# Patient Record
Sex: Male | Born: 1935 | Race: White | Hispanic: No | Marital: Married | Smoking: Former smoker
Health system: Southern US, Community
[De-identification: ages and names within clinical notes are randomized; demographics above are authoritative.]

## PROBLEM LIST (undated history)

## (undated) DIAGNOSIS — M1990 Unspecified osteoarthritis, unspecified site: Secondary | ICD-10-CM

## (undated) DIAGNOSIS — F419 Anxiety disorder, unspecified: Secondary | ICD-10-CM

## (undated) DIAGNOSIS — K219 Gastro-esophageal reflux disease without esophagitis: Secondary | ICD-10-CM

## (undated) DIAGNOSIS — G629 Polyneuropathy, unspecified: Secondary | ICD-10-CM

## (undated) DIAGNOSIS — G8929 Other chronic pain: Secondary | ICD-10-CM

## (undated) DIAGNOSIS — M545 Low back pain: Secondary | ICD-10-CM

## (undated) DIAGNOSIS — T7840XA Allergy, unspecified, initial encounter: Secondary | ICD-10-CM

## (undated) DIAGNOSIS — I10 Essential (primary) hypertension: Secondary | ICD-10-CM

## (undated) HISTORY — DX: Polyneuropathy, unspecified: G62.9

## (undated) HISTORY — PX: TONSILLECTOMY: SUR1361

## (undated) HISTORY — PX: APPENDECTOMY: SHX54

## (undated) HISTORY — DX: Unspecified osteoarthritis, unspecified site: M19.90

## (undated) HISTORY — DX: Allergy, unspecified, initial encounter: T78.40XA

## (undated) HISTORY — DX: Other chronic pain: G89.29

## (undated) HISTORY — DX: Low back pain: M54.5

## (undated) HISTORY — PX: EYE SURGERY: SHX253

## (undated) HISTORY — PX: HERNIA REPAIR: SHX51

---

## 2009-12-25 ENCOUNTER — Encounter: Admission: RE | Admit: 2009-12-25 | Discharge: 2009-12-25 | Payer: Self-pay | Admitting: Family Medicine

## 2010-11-09 HISTORY — PX: ROTATOR CUFF REPAIR: SHX139

## 2011-10-23 ENCOUNTER — Other Ambulatory Visit: Payer: Self-pay | Admitting: Family Medicine

## 2011-10-23 DIAGNOSIS — M545 Low back pain: Secondary | ICD-10-CM

## 2011-10-29 ENCOUNTER — Ambulatory Visit
Admission: RE | Admit: 2011-10-29 | Discharge: 2011-10-29 | Disposition: A | Discharge status: Home / Self Care | Payer: Medicare Other | Source: Ambulatory Visit | Attending: Family Medicine | Admitting: Family Medicine

## 2011-10-29 DIAGNOSIS — M545 Low back pain: Secondary | ICD-10-CM

## 2013-11-09 HISTORY — PX: BACK SURGERY: SHX140

## 2013-12-19 ENCOUNTER — Other Ambulatory Visit: Payer: Self-pay | Admitting: Neurosurgery

## 2013-12-27 ENCOUNTER — Encounter (HOSPITAL_COMMUNITY): Payer: Self-pay | Admitting: Pharmacy Technician

## 2013-12-29 NOTE — Pre-Procedure Instructions (Signed)
Anthony Clay  12/29/2013   Your procedure is scheduled on:  Wednesday, March 4th  Report to Admitting at 0630 AM.  Call this number if you have problems the morning of surgery: [phone]   Remember:   Do not eat food or drink liquids after midnight.   Take these medicines the morning of surgery with A SIP OF WATER: Norvasc   Do not wear jewelry.  Do not wear lotions, powders, or perfume,deodorant.  Do not shave 48 hours prior to surgery. Men may shave face and neck.  Do not bring valuables to the hospital.  [facility] is not responsible for any belongings or valuables.               Contacts, dentures or bridgework may not be worn into surgery.  Leave suitcase in the car. After surgery it may be brought to your room.  For patients admitted to the hospital, discharge time is determined by your treatment team.               Patients discharged the day of surgery will not be allowed to drive home.  Please read over the following fact sheets that you were given: Pain Booklet, Coughing and Deep Breathing, Blood Transfusion Information, MRSA Information and Surgical Site Infection Prevention  [place] - Preparing for Surgery  Before surgery, you can play an important role.  Because skin is not sterile, your skin needs to be as free of germs as possible.  You can reduce the number of germs on you skin by washing with CHG (chlorahexidine gluconate) soap before surgery.  CHG is an antiseptic cleaner which kills germs and bonds with the skin to continue killing germs even after washing.  Please DO NOT use if you have an allergy to CHG or antibacterial soaps.  If your skin becomes reddened/irritated stop using the CHG and inform your nurse when you arrive at Short Stay.  Do not shave (including legs and underarms) for at least 48 hours prior to the first CHG shower.  You may shave your face.  Please follow these instructions carefully:   1.  Shower with CHG Soap the night before  surgery and the morning of Surgery.  2.  If you choose to wash your hair, wash your hair first as usual with your normal shampoo.  3.  After you shampoo, rinse your hair and body thoroughly to remove the shampoo.  4.  Use CHG as you would any other liquid soap.  You can apply CHG directly to the skin and wash gently with scrungie or a clean washcloth.  5.  Apply the CHG Soap to your body ONLY FROM THE NECK DOWN.  Do not use on open wounds or open sores.  Avoid contact with your eyes, ears, mouth and genitals (private parts).  Wash genitals (private parts) with your normal soap.  6.  Wash thoroughly, paying special attention to the area where your surgery will be performed.  7.  Thoroughly rinse your body with warm water from the neck down.  8.  DO NOT shower/wash with your normal soap after using and rinsing off the CHG Soap.  9.  Pat yourself dry with a clean towel.            10.  Wear clean pajamas.            11.  Place clean sheets on your bed the night of your first shower and do not sleep with pets.  Day  of Surgery  Do not apply any lotions/deoderants the morning of surgery.  Please wear clean clothes to the hospital/surgery center.

## 2014-01-01 ENCOUNTER — Encounter (HOSPITAL_COMMUNITY)
Admission: RE | Admit: 2014-01-01 | Discharge: 2014-01-01 | Disposition: A | Discharge status: Home / Self Care | Payer: Medicare Other | Source: Ambulatory Visit | Attending: Neurosurgery | Admitting: Neurosurgery

## 2014-01-01 ENCOUNTER — Encounter (HOSPITAL_COMMUNITY): Payer: Self-pay

## 2014-01-01 ENCOUNTER — Encounter (HOSPITAL_COMMUNITY)
Admission: RE | Admit: 2014-01-01 | Discharge: 2014-01-01 | Disposition: A | Discharge status: Home / Self Care | Payer: Medicare Other | Source: Ambulatory Visit | Attending: Anesthesiology | Admitting: Anesthesiology

## 2014-01-01 DIAGNOSIS — Z01810 Encounter for preprocedural cardiovascular examination: Secondary | ICD-10-CM | POA: Insufficient documentation

## 2014-01-01 DIAGNOSIS — Z01818 Encounter for other preprocedural examination: Secondary | ICD-10-CM | POA: Insufficient documentation

## 2014-01-01 DIAGNOSIS — Z01812 Encounter for preprocedural laboratory examination: Secondary | ICD-10-CM | POA: Insufficient documentation

## 2014-01-01 HISTORY — DX: Unspecified osteoarthritis, unspecified site: M19.90

## 2014-01-01 HISTORY — DX: Gastro-esophageal reflux disease without esophagitis: K21.9

## 2014-01-01 HISTORY — DX: Essential (primary) hypertension: I10

## 2014-01-01 HISTORY — DX: Anxiety disorder, unspecified: F41.9

## 2014-01-01 LAB — CBC
HCT: 37.4 % — ABNORMAL LOW (ref 39.0–52.0)
HEMOGLOBIN: 12.5 g/dL — AB (ref 13.0–17.0)
MCH: 30.3 pg (ref 26.0–34.0)
MCHC: 33.4 g/dL (ref 30.0–36.0)
MCV: 90.8 fL (ref 78.0–100.0)
Platelets: 229 10*3/uL (ref 150–400)
RBC: 4.12 MIL/uL — ABNORMAL LOW (ref 4.22–5.81)
RDW: 15.3 % (ref 11.5–15.5)
WBC: 6.5 10*3/uL (ref 4.0–10.5)

## 2014-01-01 LAB — BASIC METABOLIC PANEL
BUN: 28 mg/dL — ABNORMAL HIGH (ref 6–23)
CALCIUM: 9.4 mg/dL (ref 8.4–10.5)
CO2: 26 mEq/L (ref 19–32)
CREATININE: 1.06 mg/dL (ref 0.50–1.35)
Chloride: 101 mEq/L (ref 96–112)
GFR, EST AFRICAN AMERICAN: 73 mL/min — AB (ref >90)
GFR, EST NON AFRICAN AMERICAN: 63 mL/min — AB (ref >90)
Glucose, Bld: 94 mg/dL (ref 70–99)
Potassium: 4.9 mEq/L (ref 3.7–5.3)
SODIUM: 139 meq/L (ref 137–147)

## 2014-01-01 LAB — ABO/RH: ABO/RH(D): O POS

## 2014-01-01 LAB — TYPE AND SCREEN
ABO/RH(D): O POS
ANTIBODY SCREEN: NEGATIVE

## 2014-01-01 LAB — SURGICAL PCR SCREEN
MRSA, PCR: NEGATIVE
Staphylococcus aureus: POSITIVE — AB

## 2014-01-01 NOTE — Progress Notes (Signed)
01/01/14 0814  OBSTRUCTIVE SLEEP APNEA  Have you ever been diagnosed with sleep apnea through a sleep study? No  Do you snore loudly (loud enough to be heard through closed doors)?  0  Do you often feel tired, fatigued, or sleepy during the daytime? 1 (d/t recent addition of medications)  Has anyone observed you stop breathing during your sleep? 0  Do you have, or are you being treated for high blood pressure? 1  BMI more than 35 kg/m2? 0  Age over 78 years old? 1  Neck circumference greater than 40 cm/18 inches? 0  Gender: 1  Obstructive Sleep Apnea Score 4  Score 4 or greater  Results sent to PCP

## 2014-01-09 MED ORDER — CEFAZOLIN SODIUM-DEXTROSE 2-3 GM-% IV SOLR
2.0000 g | INTRAVENOUS | Status: AC
  Administered 2014-01-10: 2 g via INTRAVENOUS
  Filled 2014-01-09: qty 50

## 2014-01-10 ENCOUNTER — Inpatient Hospital Stay (HOSPITAL_COMMUNITY)
Admission: RE | Admit: 2014-01-10 | Discharge: 2014-01-17 | DRG: 457 | Disposition: A | Discharge status: Skilled Nursing Facility | Payer: Medicare Other | Source: Ambulatory Visit | Attending: Neurosurgery | Admitting: Neurosurgery

## 2014-01-10 ENCOUNTER — Encounter (HOSPITAL_COMMUNITY)
Admission: RE | Disposition: A | Discharge status: Skilled Nursing Facility | Payer: Medicare Other | Source: Ambulatory Visit | Attending: Neurosurgery

## 2014-01-10 ENCOUNTER — Inpatient Hospital Stay (HOSPITAL_COMMUNITY): Payer: Medicare Other

## 2014-01-10 ENCOUNTER — Encounter (HOSPITAL_COMMUNITY): Payer: Medicare Other | Admitting: Anesthesiology

## 2014-01-10 ENCOUNTER — Encounter (HOSPITAL_COMMUNITY): Payer: Self-pay | Admitting: *Deleted

## 2014-01-10 ENCOUNTER — Inpatient Hospital Stay (HOSPITAL_COMMUNITY): Payer: Medicare Other | Admitting: Anesthesiology

## 2014-01-10 DIAGNOSIS — M4316 Spondylolisthesis, lumbar region: Secondary | ICD-10-CM | POA: Diagnosis present

## 2014-01-10 DIAGNOSIS — G9741 Accidental puncture or laceration of dura during a procedure: Secondary | ICD-10-CM | POA: Diagnosis not present

## 2014-01-10 DIAGNOSIS — Z79899 Other long term (current) drug therapy: Secondary | ICD-10-CM

## 2014-01-10 DIAGNOSIS — I10 Essential (primary) hypertension: Secondary | ICD-10-CM | POA: Diagnosis present

## 2014-01-10 DIAGNOSIS — M47817 Spondylosis without myelopathy or radiculopathy, lumbosacral region: Secondary | ICD-10-CM | POA: Diagnosis present

## 2014-01-10 DIAGNOSIS — M129 Arthropathy, unspecified: Secondary | ICD-10-CM | POA: Diagnosis present

## 2014-01-10 DIAGNOSIS — M4120 Other idiopathic scoliosis, site unspecified: Principal | ICD-10-CM | POA: Diagnosis present

## 2014-01-10 DIAGNOSIS — M48061 Spinal stenosis, lumbar region without neurogenic claudication: Secondary | ICD-10-CM | POA: Diagnosis present

## 2014-01-10 DIAGNOSIS — M4310 Spondylolisthesis, site unspecified: Secondary | ICD-10-CM | POA: Diagnosis present

## 2014-01-10 DIAGNOSIS — K219 Gastro-esophageal reflux disease without esophagitis: Secondary | ICD-10-CM | POA: Diagnosis present

## 2014-01-10 DIAGNOSIS — F411 Generalized anxiety disorder: Secondary | ICD-10-CM | POA: Diagnosis present

## 2014-01-10 DIAGNOSIS — IMO0002 Reserved for concepts with insufficient information to code with codable children: Secondary | ICD-10-CM | POA: Diagnosis not present

## 2014-01-10 SURGERY — POSTERIOR LUMBAR FUSION 3 LEVEL
Anesthesia: General | Site: Spine Lumbar

## 2014-01-10 MED ORDER — PHENYLEPHRINE HCL 10 MG/ML IJ SOLN
INTRAMUSCULAR | Status: DC | PRN
  Administered 2014-01-10: 80 ug via INTRAVENOUS

## 2014-01-10 MED ORDER — AMLODIPINE BESYLATE 5 MG PO TABS
5.0000 mg | ORAL_TABLET | Freq: Every day | ORAL | Status: DC
  Administered 2014-01-11 – 2014-01-17 (×7): 5 mg via ORAL
  Filled 2014-01-10 (×7): qty 1

## 2014-01-10 MED ORDER — DIAZEPAM 5 MG PO TABS
5.0000 mg | ORAL_TABLET | Freq: Four times a day (QID) | ORAL | Status: DC | PRN
  Administered 2014-01-11 – 2014-01-16 (×9): 5 mg via ORAL
  Filled 2014-01-10 (×9): qty 1

## 2014-01-10 MED ORDER — SODIUM CHLORIDE 0.9 % IJ SOLN: 3.0000 mL | INTRAMUSCULAR | Status: DC | PRN

## 2014-01-10 MED ORDER — FENTANYL CITRATE 0.05 MG/ML IJ SOLN
INTRAMUSCULAR | Status: DC | PRN
  Administered 2014-01-10: 50 ug via INTRAVENOUS
  Administered 2014-01-10: 100 ug via INTRAVENOUS
  Administered 2014-01-10 (×3): 50 ug via INTRAVENOUS

## 2014-01-10 MED ORDER — HYDROMORPHONE HCL PF 1 MG/ML IJ SOLN: 0.2500 mg | INTRAMUSCULAR | Status: DC | PRN

## 2014-01-10 MED ORDER — ROCURONIUM BROMIDE 100 MG/10ML IV SOLN
INTRAVENOUS | Status: DC | PRN
  Administered 2014-01-10 (×4): 10 mg via INTRAVENOUS
  Administered 2014-01-10: 50 mg via INTRAVENOUS
  Administered 2014-01-10 (×6): 10 mg via INTRAVENOUS

## 2014-01-10 MED ORDER — LIDOCAINE HCL (CARDIAC) 20 MG/ML IV SOLN
INTRAVENOUS | Status: DC | PRN
  Administered 2014-01-10: 100 mg via INTRAVENOUS

## 2014-01-10 MED ORDER — FERROUS SULFATE 325 (65 FE) MG PO TABS
325.0000 mg | ORAL_TABLET | Freq: Every day | ORAL | Status: DC
  Administered 2014-01-11 – 2014-01-17 (×7): 325 mg via ORAL
  Filled 2014-01-10 (×9): qty 1

## 2014-01-10 MED ORDER — CEFAZOLIN SODIUM 1-5 GM-% IV SOLN
1.0000 g | Freq: Three times a day (TID) | INTRAVENOUS | Status: AC
  Administered 2014-01-10 – 2014-01-11 (×2): 1 g via INTRAVENOUS
  Filled 2014-01-10 (×2): qty 50

## 2014-01-10 MED ORDER — EPHEDRINE SULFATE 50 MG/ML IJ SOLN
INTRAMUSCULAR | Status: DC | PRN
  Administered 2014-01-10: 10 mg via INTRAVENOUS

## 2014-01-10 MED ORDER — ACETAMINOPHEN 650 MG RE SUPP: 650.0000 mg | RECTAL | Status: DC | PRN

## 2014-01-10 MED ORDER — OXYCODONE HCL 5 MG PO TABS: 5.0000 mg | ORAL_TABLET | Freq: Once | ORAL | Status: DC | PRN

## 2014-01-10 MED ORDER — IRON 325 (65 FE) MG PO TABS: 1.0000 | ORAL_TABLET | Freq: Every day | ORAL | Status: DC

## 2014-01-10 MED ORDER — ONDANSETRON HCL 4 MG/2ML IJ SOLN
INTRAMUSCULAR | Status: DC | PRN
  Administered 2014-01-10: 4 mg via INTRAVENOUS

## 2014-01-10 MED ORDER — POLYETHYLENE GLYCOL 3350 17 G PO PACK
17.0000 g | PACK | Freq: Every day | ORAL | Status: DC | PRN
  Administered 2014-01-13 – 2014-01-14 (×2): 17 g via ORAL
  Filled 2014-01-10 (×2): qty 1

## 2014-01-10 MED ORDER — THROMBIN 20000 UNITS EX SOLR
CUTANEOUS | Status: DC | PRN
  Administered 2014-01-10: 10:00:00 via TOPICAL

## 2014-01-10 MED ORDER — HYDROMORPHONE HCL PF 1 MG/ML IJ SOLN
0.5000 mg | INTRAMUSCULAR | Status: DC | PRN
  Administered 2014-01-12: 1 mg via INTRAVENOUS
  Filled 2014-01-10: qty 1

## 2014-01-10 MED ORDER — PROPOFOL 10 MG/ML IV BOLUS
INTRAVENOUS | Status: AC
  Filled 2014-01-10: qty 20

## 2014-01-10 MED ORDER — PHENYLEPHRINE HCL 10 MG/ML IJ SOLN
10.0000 mg | INTRAVENOUS | Status: DC | PRN
  Administered 2014-01-10: 10 ug/min via INTRAVENOUS

## 2014-01-10 MED ORDER — POTASSIUM CHLORIDE IN NACL 20-0.9 MEQ/L-% IV SOLN
INTRAVENOUS | Status: DC
  Administered 2014-01-10: 20:00:00 via INTRAVENOUS
  Administered 2014-01-11: 90 mL via INTRAVENOUS
  Filled 2014-01-10 (×17): qty 1000

## 2014-01-10 MED ORDER — CITALOPRAM HYDROBROMIDE 20 MG PO TABS
20.0000 mg | ORAL_TABLET | Freq: Every day | ORAL | Status: DC
  Administered 2014-01-10 – 2014-01-16 (×7): 20 mg via ORAL
  Filled 2014-01-10 (×9): qty 1

## 2014-01-10 MED ORDER — ONDANSETRON HCL 4 MG/2ML IJ SOLN
INTRAMUSCULAR | Status: AC
  Filled 2014-01-10: qty 2

## 2014-01-10 MED ORDER — EPHEDRINE SULFATE 50 MG/ML IJ SOLN
INTRAMUSCULAR | Status: AC
  Filled 2014-01-10: qty 1

## 2014-01-10 MED ORDER — ROCURONIUM BROMIDE 50 MG/5ML IV SOLN
INTRAVENOUS | Status: AC
  Filled 2014-01-10: qty 3

## 2014-01-10 MED ORDER — ARTIFICIAL TEARS OP OINT
TOPICAL_OINTMENT | OPHTHALMIC | Status: AC
  Filled 2014-01-10: qty 3.5

## 2014-01-10 MED ORDER — SODIUM CHLORIDE 0.9 % IV SOLN: 250.0000 mL | INTRAVENOUS | Status: DC

## 2014-01-10 MED ORDER — NEOSTIGMINE METHYLSULFATE 1 MG/ML IJ SOLN
INTRAMUSCULAR | Status: DC | PRN
  Administered 2014-01-10: 4 mg via INTRAVENOUS

## 2014-01-10 MED ORDER — BUPIVACAINE HCL (PF) 0.5 % IJ SOLN
INTRAMUSCULAR | Status: DC | PRN
  Administered 2014-01-10: 30 mL

## 2014-01-10 MED ORDER — BRIMONIDINE TARTRATE 0.15 % OP SOLN
1.0000 [drp] | Freq: Every day | OPHTHALMIC | Status: DC
  Filled 2014-01-10: qty 5

## 2014-01-10 MED ORDER — PHENYLEPHRINE HCL 10 MG/ML IJ SOLN
INTRAMUSCULAR | Status: AC
  Filled 2014-01-10: qty 1

## 2014-01-10 MED ORDER — LACTATED RINGERS IV SOLN
INTRAVENOUS | Status: DC | PRN
  Administered 2014-01-10 (×3): via INTRAVENOUS

## 2014-01-10 MED ORDER — METOCLOPRAMIDE HCL 5 MG/ML IJ SOLN: 10.0000 mg | Freq: Once | INTRAMUSCULAR | Status: DC | PRN

## 2014-01-10 MED ORDER — FENTANYL CITRATE 0.05 MG/ML IJ SOLN
INTRAMUSCULAR | Status: AC
  Filled 2014-01-10: qty 5

## 2014-01-10 MED ORDER — BACITRACIN ZINC 500 UNIT/GM EX OINT
TOPICAL_OINTMENT | CUTANEOUS | Status: DC | PRN
  Administered 2014-01-10: 1 via TOPICAL

## 2014-01-10 MED ORDER — CALCIUM CARBONATE 600 MG PO TABS
600.0000 mg | ORAL_TABLET | Freq: Every day | ORAL | Status: DC
  Filled 2014-01-10 (×2): qty 1

## 2014-01-10 MED ORDER — GLYCOPYRROLATE 0.2 MG/ML IJ SOLN
INTRAMUSCULAR | Status: DC | PRN
  Administered 2014-01-10: .7 mg via INTRAVENOUS

## 2014-01-10 MED ORDER — 0.9 % SODIUM CHLORIDE (POUR BTL) OPTIME
TOPICAL | Status: DC | PRN
  Administered 2014-01-10 (×2): 1000 mL

## 2014-01-10 MED ORDER — LIDOCAINE-EPINEPHRINE 0.5 %-1:200000 IJ SOLN
INTRAMUSCULAR | Status: DC | PRN
  Administered 2014-01-10: 20 mL

## 2014-01-10 MED ORDER — SODIUM CHLORIDE 0.9 % IV SOLN
INTRAVENOUS | Status: DC | PRN
  Administered 2014-01-10 (×2): via INTRAVENOUS

## 2014-01-10 MED ORDER — ACETAMINOPHEN 325 MG PO TABS
650.0000 mg | ORAL_TABLET | ORAL | Status: DC | PRN
Start: 2014-01-10 — End: 2014-01-17

## 2014-01-10 MED ORDER — LIDOCAINE HCL (CARDIAC) 20 MG/ML IV SOLN
INTRAVENOUS | Status: AC
  Filled 2014-01-10: qty 5

## 2014-01-10 MED ORDER — ONDANSETRON HCL 4 MG/2ML IJ SOLN: 4.0000 mg | INTRAMUSCULAR | Status: DC | PRN

## 2014-01-10 MED ORDER — ADULT MULTIVITAMIN W/MINERALS CH
1.0000 | ORAL_TABLET | Freq: Every day | ORAL | Status: DC
  Administered 2014-01-11 – 2014-01-17 (×7): 1 via ORAL
  Filled 2014-01-10 (×7): qty 1

## 2014-01-10 MED ORDER — LATANOPROST 0.005 % OP SOLN
1.0000 [drp] | Freq: Every day | OPHTHALMIC | Status: DC
  Administered 2014-01-10 – 2014-01-16 (×7): 1 [drp] via OPHTHALMIC
  Filled 2014-01-10 (×2): qty 2.5

## 2014-01-10 MED ORDER — PHENOL 1.4 % MT LIQD: 1.0000 | OROMUCOSAL | Status: DC | PRN

## 2014-01-10 MED ORDER — MENTHOL 3 MG MT LOZG: 1.0000 | LOZENGE | OROMUCOSAL | Status: DC | PRN

## 2014-01-10 MED ORDER — HYDROCODONE-ACETAMINOPHEN 5-325 MG PO TABS
1.0000 | ORAL_TABLET | ORAL | Status: DC | PRN
  Administered 2014-01-12 (×2): 2 via ORAL
  Filled 2014-01-10 (×2): qty 2

## 2014-01-10 MED ORDER — PROPOFOL 10 MG/ML IV BOLUS
INTRAVENOUS | Status: DC | PRN
  Administered 2014-01-10: 200 mg via INTRAVENOUS

## 2014-01-10 MED ORDER — GLYCOPYRROLATE 0.2 MG/ML IJ SOLN
INTRAMUSCULAR | Status: AC
  Filled 2014-01-10: qty 2

## 2014-01-10 MED ORDER — NEOSTIGMINE METHYLSULFATE 1 MG/ML IJ SOLN
INTRAMUSCULAR | Status: AC
  Filled 2014-01-10: qty 10

## 2014-01-10 MED ORDER — ALBUMIN HUMAN 5 % IV SOLN
INTRAVENOUS | Status: DC | PRN
  Administered 2014-01-10 (×2): via INTRAVENOUS

## 2014-01-10 MED ORDER — SODIUM CHLORIDE 0.9 % IJ SOLN
3.0000 mL | Freq: Two times a day (BID) | INTRAMUSCULAR | Status: DC
  Administered 2014-01-10 – 2014-01-16 (×10): 3 mL via INTRAVENOUS

## 2014-01-10 MED ORDER — ROCURONIUM BROMIDE 50 MG/5ML IV SOLN
INTRAVENOUS | Status: AC
  Filled 2014-01-10: qty 1

## 2014-01-10 MED ORDER — OXYCODONE HCL 5 MG/5ML PO SOLN: 5.0000 mg | Freq: Once | ORAL | Status: DC | PRN

## 2014-01-10 MED ORDER — SENNA 8.6 MG PO TABS
1.0000 | ORAL_TABLET | Freq: Two times a day (BID) | ORAL | Status: DC
  Administered 2014-01-10 – 2014-01-17 (×14): 8.6 mg via ORAL
  Filled 2014-01-10 (×18): qty 1

## 2014-01-10 MED ORDER — OXYCODONE-ACETAMINOPHEN 5-325 MG PO TABS
1.0000 | ORAL_TABLET | ORAL | Status: DC | PRN
  Administered 2014-01-10 – 2014-01-12 (×4): 2 via ORAL
  Administered 2014-01-13 (×2): 1 via ORAL
  Administered 2014-01-13 – 2014-01-17 (×11): 2 via ORAL
  Filled 2014-01-10 (×3): qty 2
  Filled 2014-01-10: qty 1
  Filled 2014-01-10 (×13): qty 2

## 2014-01-10 MED ORDER — IRBESARTAN 150 MG PO TABS
150.0000 mg | ORAL_TABLET | Freq: Every day | ORAL | Status: DC
  Administered 2014-01-10 – 2014-01-17 (×8): 150 mg via ORAL
  Filled 2014-01-10 (×8): qty 1

## 2014-01-10 MED ORDER — CEFAZOLIN SODIUM-DEXTROSE 2-3 GM-% IV SOLR
INTRAVENOUS | Status: AC
  Filled 2014-01-10: qty 50

## 2014-01-10 MED ORDER — BRIMONIDINE TARTRATE 0.2 % OP SOLN
1.0000 [drp] | Freq: Every day | OPHTHALMIC | Status: DC
  Administered 2014-01-10 – 2014-01-16 (×7): 1 [drp] via OPHTHALMIC
  Filled 2014-01-10 (×2): qty 5

## 2014-01-10 SURGICAL SUPPLY — 84 items
BAG DECANTER FOR FLEXI CONT (MISCELLANEOUS) IMPLANT
BENZOIN TINCTURE PRP APPL 2/3 (GAUZE/BANDAGES/DRESSINGS) IMPLANT
BLADE SURG ROTATE 9660 (MISCELLANEOUS) IMPLANT
BUR MATCHSTICK NEURO 3.0 LAGG (BURR) ×3 IMPLANT
BUR PRECISION FLUTE 5.0 (BURR) ×3 IMPLANT
CAGE COROENT 9X9X23-4 (Cage) ×3 IMPLANT
CAGE COROENT MP 8X23 (Cage) ×3 IMPLANT
CANISTER SUCT 3000ML (MISCELLANEOUS) ×3 IMPLANT
CLOSURE WOUND 1/2 X4 (GAUZE/BANDAGES/DRESSINGS)
CONT SPEC 4OZ CLIKSEAL STRL BL (MISCELLANEOUS) ×3 IMPLANT
COVER BACK TABLE 24X17X13 BIG (DRAPES) IMPLANT
COVER TABLE BACK 60X90 (DRAPES) ×3 IMPLANT
DECANTER SPIKE VIAL GLASS SM (MISCELLANEOUS) ×3 IMPLANT
DERMABOND ADVANCED (GAUZE/BANDAGES/DRESSINGS)
DERMABOND ADVANCED .7 DNX12 (GAUZE/BANDAGES/DRESSINGS) IMPLANT
DRAPE C-ARM 42X72 X-RAY (DRAPES) ×6 IMPLANT
DRAPE LAPAROTOMY 100X72X124 (DRAPES) ×3 IMPLANT
DRAPE POUCH INSTRU U-SHP 10X18 (DRAPES) ×3 IMPLANT
DRAPE PROXIMA HALF (DRAPES) ×3 IMPLANT
DRAPE SURG 17X23 STRL (DRAPES) ×3 IMPLANT
DRESSING TELFA 8X3 (GAUZE/BANDAGES/DRESSINGS) IMPLANT
DRSG OPSITE POSTOP 4X10 (GAUZE/BANDAGES/DRESSINGS) ×3 IMPLANT
DURAPREP 26ML APPLICATOR (WOUND CARE) ×3 IMPLANT
DURASEAL APPLICATOR TIP (TIP) ×3 IMPLANT
DURASEAL SPINE SEALANT 3ML (MISCELLANEOUS) ×3 IMPLANT
ELECT REM PT RETURN 9FT ADLT (ELECTROSURGICAL) ×3
ELECTRODE REM PT RTRN 9FT ADLT (ELECTROSURGICAL) ×1 IMPLANT
GAUZE SPONGE 4X4 16PLY XRAY LF (GAUZE/BANDAGES/DRESSINGS) ×3 IMPLANT
GLOVE BIO SURGEON STRL SZ 6.5 (GLOVE) ×8 IMPLANT
GLOVE BIO SURGEONS STRL SZ 6.5 (GLOVE) ×4
GLOVE BIOGEL PI IND STRL 6.5 (GLOVE) ×1 IMPLANT
GLOVE BIOGEL PI IND STRL 7.0 (GLOVE) ×3 IMPLANT
GLOVE BIOGEL PI IND STRL 7.5 (GLOVE) ×2 IMPLANT
GLOVE BIOGEL PI INDICATOR 6.5 (GLOVE) ×2
GLOVE BIOGEL PI INDICATOR 7.0 (GLOVE) ×6
GLOVE BIOGEL PI INDICATOR 7.5 (GLOVE) ×4
GLOVE ECLIPSE 6.5 STRL STRAW (GLOVE) ×9 IMPLANT
GLOVE ECLIPSE 7.5 STRL STRAW (GLOVE) ×6 IMPLANT
GLOVE EXAM NITRILE LRG STRL (GLOVE) IMPLANT
GLOVE EXAM NITRILE MD LF STRL (GLOVE) IMPLANT
GLOVE EXAM NITRILE XL STR (GLOVE) IMPLANT
GLOVE EXAM NITRILE XS STR PU (GLOVE) IMPLANT
GLOVE SS BIOGEL STRL SZ 6.5 (GLOVE) ×4 IMPLANT
GLOVE SUPERSENSE BIOGEL SZ 6.5 (GLOVE) ×8
GOWN BRE IMP SLV AUR LG STRL (GOWN DISPOSABLE) IMPLANT
GOWN BRE IMP SLV AUR XL STRL (GOWN DISPOSABLE) IMPLANT
GOWN STRL REIN 2XL LVL4 (GOWN DISPOSABLE) IMPLANT
GOWN STRL REUS W/ TWL LRG LVL3 (GOWN DISPOSABLE) ×4 IMPLANT
GOWN STRL REUS W/ TWL XL LVL3 (GOWN DISPOSABLE) ×1 IMPLANT
GOWN STRL REUS W/TWL LRG LVL3 (GOWN DISPOSABLE) ×8
GOWN STRL REUS W/TWL XL LVL3 (GOWN DISPOSABLE) ×2
KIT BASIN OR (CUSTOM PROCEDURE TRAY) ×3 IMPLANT
KIT POSITION SURG JACKSON T1 (MISCELLANEOUS) ×3 IMPLANT
KIT ROOM TURNOVER OR (KITS) ×3 IMPLANT
MILL MEDIUM DISP (BLADE) ×3 IMPLANT
NEEDLE HYPO 25X1 1.5 SAFETY (NEEDLE) ×3 IMPLANT
NEEDLE SPNL 18GX3.5 QUINCKE PK (NEEDLE) ×3 IMPLANT
NS IRRIG 1000ML POUR BTL (IV SOLUTION) ×6 IMPLANT
PACK LAMINECTOMY NEURO (CUSTOM PROCEDURE TRAY) ×3 IMPLANT
PAD ARMBOARD 7.5X6 YLW CONV (MISCELLANEOUS) ×6 IMPLANT
PATTIES SURGICAL .5 X.5 (GAUZE/BANDAGES/DRESSINGS) ×3 IMPLANT
ROD PREBENT ARMADA 90MM SPINE (Rod) ×6 IMPLANT
SCREW 6.5X40MM (Screw) ×8 IMPLANT
SCREW BN 40X6.5XPA NS SPNE (Screw) ×4 IMPLANT
SCREW LOCK (Screw) ×16 IMPLANT
SCREW LOCK 100X5.5X OPN (Screw) ×8 IMPLANT
SCREW POLY 45X6.5 (Screw) ×4 IMPLANT
SCREW POLY 6.5X45MM (Screw) ×8 IMPLANT
SLEEVE SURGEON STRL (DRAPES) ×3 IMPLANT
SPONGE GAUZE 4X4 12PLY (GAUZE/BANDAGES/DRESSINGS) IMPLANT
SPONGE LAP 4X18 X RAY DECT (DISPOSABLE) ×3 IMPLANT
SPONGE SURGIFOAM ABS GEL 100 (HEMOSTASIS) ×3 IMPLANT
STRIP CLOSURE SKIN 1/2X4 (GAUZE/BANDAGES/DRESSINGS) IMPLANT
SUT ETHILON 3 0 PS 1 (SUTURE) ×6 IMPLANT
SUT PROLENE 6 0 BV (SUTURE) ×6 IMPLANT
SUT VIC AB 0 CT1 18XCR BRD8 (SUTURE) ×2 IMPLANT
SUT VIC AB 0 CT1 8-18 (SUTURE) ×4
SUT VIC AB 2-0 CT1 18 (SUTURE) ×6 IMPLANT
SUT VIC AB 3-0 SH 8-18 (SUTURE) ×3 IMPLANT
SYR 20ML ECCENTRIC (SYRINGE) ×3 IMPLANT
TOWEL OR 17X24 6PK STRL BLUE (TOWEL DISPOSABLE) ×3 IMPLANT
TOWEL OR 17X26 10 PK STRL BLUE (TOWEL DISPOSABLE) ×3 IMPLANT
TRAY FOLEY CATH 16FRSI W/METER (SET/KITS/TRAYS/PACK) ×3 IMPLANT
WATER STERILE IRR 1000ML POUR (IV SOLUTION) ×3 IMPLANT

## 2014-01-10 NOTE — Transfer of Care (Signed)
Immediate Anesthesia Transfer of Care Note  Patient: Anthony Clay  Procedure(s) Performed: Procedure(s): LUMBAR TWO-THREE,LUMBAR THREE-FOUR,LUMBAR FOUR-FIVE POSTERIOR LUMBAR INTERBODY FUSOION WITH INTERBODY PROSTHESIS,POSTERIOR LATERAL ARTHRODESIS AND POSTERIOR SEGMENTAL INSTRUMENTATION (N/A)  Patient Location: PACU  Anesthesia Type:General  Level of Consciousness: awake and alert   Airway & Oxygen Therapy: Patient Spontanous Breathing and Patient connected to nasal cannula oxygen  Post-op Assessment: Report given to PACU RN and Post -op Vital signs reviewed and stable  Post vital signs: Reviewed and stable  Complications: No apparent anesthesia complications

## 2014-01-10 NOTE — H&P (Signed)
BP 154/87  Pulse 82  Temp(Src) 98.3 F (36.8 C) (Oral)  Resp 20 HOPI: This is a former patient of Dr. Phoebe PerchHirsch who was seen last in 12/2011. He had significant degeneration present in his lumbar spine, and Dr. Phoebe PerchHirsch, according to the patient, told him do physical therapy as long as you can and when you can no longer walk then come back to see me. Well, now is that time. Dr. Phoebe PerchHirsch has left, but he is having more problems with his walking. He has a lot more pain, and he is restricting his activities a great deal. He had grade I, almost II, listhesis of L4 on L5, retrolisthesis of L2 on L3 and L3 on L4, and there was some movement on flexion/extension films at L3-4 and L4-5 which was minimal. This was degenerative changes as the pars were intact at each level.  MEDICATIONS: He takes amlodipine, calcium, citalopram, Diovan, etodolac, glucosamine, iron tablets, latanoprost, and a multivitamin. ALLERGIES: NO KNOWN DRUG ALLERGIES. PHYSICAL EXAMINATION: Height is 71 inches. Weight 189.8 pounds. BMI is 26.47. Blood pressure is 154/75. Pulse is 67. On examination, he is alert, oriented x4 and answers all questions appropriately. Reflexes 2+ in knees and ankles bilaterally. Toes downgoing on plantar stimulation. Gait is normal. Romberg test is negative. He can toe walk and heel walk. Hearing intact to voice. Uvula elevates midline. Shoulder shrug is normal. Tongue protrudes in the midline.  [name] returns today with an MRI of the lumbar spine. What is shows is that he has significant stenosis and spondylotic change at L2-3, L3-4, and L4-5. [name] has had a great deal of pain and at this point states that he is ready to go ahead and proceed with surgery, which would include interbody arthrodesis at each of those levels. He has listhesis at L3-4 anteriorly, posterior listhesis of L3-4, and has retrolisthesis of L2 and L3. He is stenotic at each of those levels, significant and severe facet arthropathy at  each of those levels. PHYSICAL EXAMINATION: He has no change from his exam, medications, allergies from 12/11/2013. IMPRESSION/PLAN: I do not believe that there is anything reasonable to do besides offering a decompression and arthrodesis and he states that he is hurting that much at this point in time. So, I do believe that this will provide benefit for him. In L5-S1, we just don't need to do. So, [name] understood risks and benefits of surgery, bleeding, infection, no relief, need for further surgery, treatment failure, hardware failure, no relief, and weakness in the lower extremities. History reviewed. No pertinent family history. Past Medical History  Diagnosis Date  . GERD (gastroesophageal reflux disease)     occasional--takes  OTC  . Arthritis   . Hypertension   . Anxiety     ?? d/t cortisone shot   History   Social History Narrative  . No narrative on file   Prior to Admission medications   Medication Sig Start Date End Date Taking? Authorizing Provider  amLODipine (NORVASC) 5 MG tablet Take 5 mg by mouth daily.   Yes Historical Provider, MD  brimonidine (ALPHAGAN P) 0.1 % SOLN Place 1 drop into both eyes at bedtime.   Yes Historical Provider, MD  calcium carbonate (OS-CAL) 600 MG TABS tablet Take 600 mg by mouth daily with breakfast.   Yes Historical Provider, MD  citalopram (CELEXA) 20 MG tablet Take 20 mg by mouth at bedtime.   Yes Historical Provider, MD  etodolac (LODINE) 500 MG tablet Take 500 mg  by mouth 2 (two) times daily.   Yes Historical Provider, MD  Glucosamine-Chondroit-Vit C-Mn (GLUCOSAMINE 1500 COMPLEX PO) Take 1,500 mg by mouth daily.   Yes Historical Provider, MD  IRON PO Take 1 tablet by mouth daily.   Yes Historical Provider, MD  latanoprost (XALATAN) 0.005 % ophthalmic solution Place 1 drop into both eyes at bedtime.   Yes Historical Provider, MD  Multiple Vitamin (MULTIVITAMIN WITH MINERALS) TABS tablet Take 1 tablet by mouth daily.   Yes Historical  Provider, MD  valsartan (DIOVAN) 160 MG tablet Take 160 mg by mouth at bedtime.   Yes Historical Provider, MD

## 2014-01-10 NOTE — Anesthesia Preprocedure Evaluation (Addendum)
Anesthesia Evaluation  Patient identified by MRN, date of birth, ID band Patient awake    Reviewed: Allergy & Precautions, H&P , NPO status , Patient's Chart, lab work & pertinent test results, reviewed documented beta blocker date and time   History of Anesthesia Complications Negative for: history of anesthetic complications  Airway Mallampati: II TM Distance: >3 FB Neck ROM: full    Dental  (+) Caps, Dental Advisory Given, Teeth Intact   Pulmonary neg pulmonary ROS, former smoker,  breath sounds clear to auscultation        Cardiovascular Exercise Tolerance: Good hypertension, On Medications Rhythm:regular     Neuro/Psych Low back pain negative neurological ROS  negative psych ROS   GI/Hepatic Neg liver ROS, GERD-  Medicated and Controlled,  Endo/Other  negative endocrine ROS  Renal/GU negative Renal ROS  negative genitourinary   Musculoskeletal   Abdominal   Peds  Hematology negative hematology ROS (+)   Anesthesia Other Findings See surgeon's H&P   Reproductive/Obstetrics negative OB ROS                          Anesthesia Physical Anesthesia Plan  ASA: II  Anesthesia Plan: General   Post-op Pain Management:    Induction: Intravenous  Airway Management Planned: Oral ETT  Additional Equipment:   Intra-op Plan:   Post-operative Plan: Extubation in OR  Informed Consent: I have reviewed the patients History and Physical, chart, labs and discussed the procedure including the risks, benefits and alternatives for the proposed anesthesia with the patient or authorized representative who has indicated his/her understanding and acceptance.   Dental Advisory Given  Plan Discussed with: CRNA and Surgeon  Anesthesia Plan Comments:         Anesthesia Quick Evaluation

## 2014-01-10 NOTE — Anesthesia Postprocedure Evaluation (Signed)
  Anesthesia Post-op Note  Patient: Anthony NighGeorge M Abernethy  Procedure(s) Performed: Procedure(s): LUMBAR TWO-THREE,LUMBAR THREE-FOUR,LUMBAR FOUR-FIVE POSTERIOR LUMBAR INTERBODY FUSOION WITH INTERBODY PROSTHESIS,POSTERIOR LATERAL ARTHRODESIS AND POSTERIOR SEGMENTAL INSTRUMENTATION (N/A)  Patient Location: NICU  Anesthesia Type:General  Level of Consciousness: awake, alert  and oriented  Airway and Oxygen Therapy: Patient Spontanous Breathing and Patient connected to nasal cannula oxygen  Post-op Pain: mild  Post-op Assessment: Post-op Vital signs reviewed, Patient's Cardiovascular Status Stable, Respiratory Function Stable, Patent Airway, No signs of Nausea or vomiting and Pain level controlled  Post-op Vital Signs: stable  Complications: No apparent anesthesia complications

## 2014-01-10 NOTE — Anesthesia Procedure Notes (Signed)
Procedure Name: Intubation Date/Time: 01/10/2014 8:58 AM Performed by: Orvilla FusATO, Adalynne Steffensmeier A Pre-anesthesia Checklist: Patient identified, Timeout performed, Emergency Drugs available, Suction available and Patient being monitored Patient Re-evaluated:Patient Re-evaluated prior to inductionOxygen Delivery Method: Circle system utilized Preoxygenation: Pre-oxygenation with 100% oxygen Intubation Type: IV induction Ventilation: Mask ventilation without difficulty and Oral airway inserted - appropriate to patient size Laryngoscope Size: Mac and 4 Grade View: Grade II Tube type: Oral Tube size: 8.0 mm Number of attempts: 1 Airway Equipment and Method: Stylet Placement Confirmation: ETT inserted through vocal cords under direct vision,  breath sounds checked- equal and bilateral and positive ETCO2 Secured at: 23 cm Tube secured with: Tape Dental Injury: Teeth and Oropharynx as per pre-operative assessment

## 2014-01-10 NOTE — Op Note (Signed)
01/10/2014  6:14 PM  PATIENT:  Anthony Clay  80 y.o. male with longstanding spondylolisthesis and concomitant stenosis at L4/5, lumbar stenosis L2/3,3/4, scoliosis lumbar  PRE-OPERATIVE DIAGNOSIS:  spondylolisthesis lumbar spondylosis lumbar radiculopathy  POST-OPERATIVE DIAGNOSIS:  Spondylolisthesis,lumbar spondylosis,lumbar radiculopathy  PROCEDURE:  Procedure(s): LUMBAR TWO-THREE,LUMBAR THREE-FOUR, LUMBAR INTERBODY FUSOION WITH INTERBODY PROSTHESIS , local autograft, nuvasive cages Lumbar decompression beyond what was necessary for the PLIF at L2/3, and L3/4, and at L4/5 to decompress the L2,3,4, and L5 roots POSTERIOR LATERAL ARTHRODESIS  L2-L5 morselized autograft  POSTERIOR SEGMENTAL INSTRUMENTATION L2-L5(nuvasive Armada)  SURGEON:  Surgeon(s): Carmela HurtKyle L Morgin Halls, MD Tia Alertavid S Jones, MD  ASSISTANTS:Jones, Onalee Huaavid  ANESTHESIA:   general  EBL:  Total I/O In: 4315 [I.V.:3125; Blood:690; IV Piggyback:500] Out: 2068 [Urine:368; Blood:1700]  BLOOD ADMINISTERED:690 CC CELLSAVER  CELL SAVER GIVEN:690cc  COUNT:per nursing  DRAINS: none   SPECIMEN:  No Specimen  DICTATION: Anthony NighGeorge M Newhouse is a 80 y.o. male whom was taken to the operating room intubated, and placed under a general anesthetic without difficulty. A foley catheter was placed under sterile conditions. He was positioned prone on a Jackson stable with all pressure points properly padded.  His lumbar region was prepped and draped in a sterile manner. I infiltrated 20cc's 1/2%lidocaine/1:2000,000 strength epinephrine into the planned incision. I opened the skin with a 10 blade and took the incision down to the thoracolumbar fascia. I exposed the lamina of L1,2,3,4,5 in a subperiosteal fashion bilaterally. I confirmed my location with an intraoperative xray.  I placed self retaining retractors and started the decompression.  I decompressed the spinal canal by performing complete laminectomies of L2,3, and L4, with a partial  laminectomy of L5. I used a GafferLeksell rongeur and a Nutcracker, along with the drill, and Kerrison punches. I removed the inferior facets of L2,3,and 4 bilaterally in order to decompress the L2,3,and L4 roots. I simply did an aggressive foraminotomy to decompress the L5 roots. In the course of the decompression an incidental durotomy was made where the dura was very adherent to the ligament and joint space material on the left side at L3. I closed this primarily and used a piece of muscle to augment the closure. I used the Kerrison to fully decompress the nerve roots in the lateral recesses at each level. I performed discetomies at each level although I only placed cages at L2/3, and 3/4.   PLIF's were performed at L2/3, and L3/4 in the same fashion. I opened the disc space with a 15 blade then used a variety of instruments to remove the disc and prepare the space for the arthrodesis. I used curettes, rongeurs, punches, shavers for the disc space, and rasps in the discetomy. I measured the disc space and placed a 9mm  Peek cages(Nuvasive) into the disc space at L2/3, and an 8mm cage at L3/4. I placed both on the left side to compensate for the scoliosis which was concave towards the right.(s). The disc spaces at each level were packed with local autograft. I did not place a cage at L4/5 as the proximity of the L4 roots were such that I did not believe I could place the cage without undue trauma to the roots.  I decorticated the lateral bone at L2,3,4, and 5. I then placed morselized local autograft on the decorticated surfaces to complete the posterolateral arthrodesis.  I  placed pedicle screws at L2,3,4, and L5, using fluoroscopic guidance. I drilled a pilot hole, then cannulated the pedicle with a pedicle probe  at each site. I then tapped each pedicle, assessing each site for pedicle violations. No cutouts were appreciated. Screws (Nuvasive armada) were then placed at each site without difficulty. Final films  were performed and all screws appeared to be in good position. Dr. Yetta Barre and I then placed the rods on each side securing them to the screws with locking caps, and completed the torquing of the caps without difficulty.  Dr. Yetta Barre and I closed the wound in a layered fashion. we approximated the thoracolumbar fascia, subcutaneous, and subcuticular planes with vicryl sutures. I closed the skin with a running 3-0 nylon suture.  I used honeycomb dressing to complete the closure.     PLAN OF CARE: Admit to inpatient   PATIENT DISPOSITION:  PACU - hemodynamically stable.   Delay start of Pharmacological VTE agent (>24hrs) due to surgical blood loss or risk of bleeding:  yes

## 2014-01-11 LAB — POCT I-STAT 4, (NA,K, GLUC, HGB,HCT)
GLUCOSE: 141 mg/dL — AB (ref 70–99)
Glucose, Bld: 152 mg/dL — ABNORMAL HIGH (ref 70–99)
HCT: 31 % — ABNORMAL LOW (ref 39.0–52.0)
HEMATOCRIT: 28 % — AB (ref 39.0–52.0)
Hemoglobin: 9.5 g/dL — ABNORMAL LOW (ref 13.0–17.0)
Hemoglobin: 10.5 g/dL — ABNORMAL LOW (ref 13.0–17.0)
POTASSIUM: 4.4 meq/L (ref 3.7–5.3)
Potassium: 4.6 mEq/L (ref 3.7–5.3)
Sodium: 140 mEq/L (ref 137–147)
Sodium: 140 mEq/L (ref 137–147)

## 2014-01-11 MED ORDER — CALCIUM CARBONATE 1250 (500 CA) MG PO TABS
1.0000 | ORAL_TABLET | Freq: Every day | ORAL | Status: DC
  Administered 2014-01-11 – 2014-01-17 (×7): 500 mg via ORAL
  Filled 2014-01-11 (×9): qty 1

## 2014-01-11 MED FILL — Sodium Chloride IV Soln 0.9%: INTRAVENOUS | Qty: 3000 | Status: AC

## 2014-01-11 MED FILL — Heparin Sodium (Porcine) Inj 1000 Unit/ML: INTRAMUSCULAR | Qty: 30 | Status: AC

## 2014-01-11 NOTE — Progress Notes (Signed)
Patient ID: Anthony Clay, male   DOB: 05/28/1928, 80 y.o.   MRN: [id number] BP 119/54  Pulse 75  Temp(Src) 98.3 F (36.8 C) (Oral)  Resp 8  Ht 5\' 11"  (1.803 m)  Wt 86.8 kg (191 lb 5.8 oz)  BMI 26.70 kg/m2  SpO2 93% Alert and oriented x 4 Moving all extremities well Wound dressing is clean, dry, no signs of infection Transfer to floor today

## 2014-01-11 NOTE — Progress Notes (Signed)
Utilization review completed.  

## 2014-01-12 NOTE — Progress Notes (Signed)
Patient ID: Anthony NighGeorge M Pett, male   DOB: August 17, 1928, 80 y.o.   MRN: [id number] BP 133/70  Pulse 85  Temp(Src) 99.8 F (37.7 C) (Oral)  Resp 16  Ht 5\' 11"  (1.803 m)  Wt 97.4 kg (214 lb 11.7 oz)  BMI 29.96 kg/m2  SpO2 96% Alert and oriented x 4 Wound is clean, dry, no signs of infection Moving lower extremities well Out of bed tomorrow

## 2014-01-13 NOTE — Progress Notes (Signed)
Patient ID: Anthony Clay, male   DOB: [birth year], 80 y.o.   MRN: [id number] BP 147/75  Pulse 92  Temp(Src) 98.4 F (36.9 C) (Oral)  Resp 20  Ht 5\' 11"  (1.803 m)  Wt 97.4 kg (214 lb 11.7 oz)  BMI 29.96 kg/m2  SpO2 94% Feels as if he is slightly confused. Answering most questions appropiately.  Moving all extremities well Will mobilize this evening.

## 2014-01-13 NOTE — Progress Notes (Signed)
Subjective: Patient reports overall is doing okay pain is well controlled  leg pain improved  no headache  Objective: Vital signs in last 24 hours: Temp:  [98.4 F (36.9 C)-99.8 F (37.7 C)] 98.6 F (37 C) (03/07 0511) Pulse Rate:  [85-99] 91 (03/07 0511) Resp:  [16-18] 18 (03/07 0511) BP: (130-147)/(64-84) 130/64 mmHg (03/07 0511) SpO2:  [91 %-96 %] 93 % (03/07 0511)  Intake/Output from previous day: 03/06 0701 - 03/07 0700 In: -  Out: 1500 [Urine:1500] Intake/Output this shift:    neurologically stable wound clean and dry  Lab Results:  Recent Labs  01/10/14 1420 01/10/14 1651  HGB 9.5* 10.5*  HCT 28.0* 31.0*   BMET  Recent Labs  01/10/14 1420 01/10/14 1651  NA 140 140  K 4.4 4.6  GLUCOSE 141* 152*    Studies/Results: No results found.  Assessment/Plan: Patient is postop day 3 from a posterior lumbar fusion with intraoperative CSF leak. Probable mobilization today per Dr. Franky Machoabbell   LOS: 3 days     Christopherjohn Schiele P 01/13/2014, 9:15 AM

## 2014-01-14 MED ORDER — MAGNESIUM CITRATE PO SOLN
1.0000 | Freq: Once | ORAL | Status: AC
  Administered 2014-01-14: 1 via ORAL
  Filled 2014-01-14: qty 296

## 2014-01-14 NOTE — Progress Notes (Signed)
PT Cancellation Note  Patient Details Name: Ronelle NighGeorge M Parkison MRN: [id number] DOB: [birth year]   Cancelled Treatment:    Reason Eval/Treat Not Completed: Other (comment)  Orders received, chart reviewed  Activity order for up with assist states to start 3/9 (5am)  Will proceed with PT eval tomorrow  Thank you,   Van ClinesGarrigan, Tyrese Capriotti [facility] 01/14/2014, 4:30 PM

## 2014-01-14 NOTE — Progress Notes (Signed)
Pt found sitting on floor by staff in front of chair. Pt states that he was trying to get out of chair and did not call for assistance. Chair alarm was on. Assessment completed and vital signs stable. No injury. Pt denies any pain. Pt assisted back to bed by 3 staff members. Pt re-educated on use of call bell and not getting up without assistance. MD notified and did not give further orders. Left voicemail for pt's wife and am awaiting her phone call.

## 2014-01-14 NOTE — Progress Notes (Signed)
Patient ID: Anthony NighGeorge M Clay, male   DOB: [birth year], 80 y.o.   MRN: [id number] [name] appears to. be doing fairly well as morning he still has  a fair amount back soreness still has some leg pain but is manageable he denies any headache. They took out his catheter by an hour ago he has not voided  Strength 5 out of 5 wound clean and dry  Continue physical therapy

## 2014-01-14 NOTE — Progress Notes (Signed)
Patient had several periods of confusion and several attempts of getting out of bed unassisted. DC foley at 6am. Pt c/o constipation, admin PRN miralax. No BM at this point. Patient is passing gas. Assisted patient to chair, max assisted 2 plus person assist.

## 2014-01-15 NOTE — Clinical Social Work Psychosocial (Signed)
Clinical Social Work Department BRIEF PSYCHOSOCIAL ASSESSMENT 01/15/2014  Patient:  UNNAMED, ZEIEN     Account Number:  [id number]     Admit date:  01/10/2014  Clinical Social Worker:  Daiva Huge  Date/Time:  01/15/2014 02:00 PM  Referred by:  Physician  Date Referred:  01/15/2014 Referred for  SNF Placement   Other Referral:   Interview type:  Patient Other interview type:   Wife at bedside    PSYCHOSOCIAL DATA Living Status:  FAMILY Admitted from facility:   Level of care:   Primary support name:  wife- Angelita Ingles [phone] Primary support relationship to patient:  SPOUSE Degree of support available:   good    CURRENT CONCERNS Current Concerns  Post-Acute Placement   Other Concerns:    SOCIAL WORK ASSESSMENT / PLAN Met with patient and wife at bedside-they are interested in looking at SNF as they feel he will likely need this before he can return home- at this time I will complete FL2 and PASARR for SNF search to see what is available for him in the area- if he does well enough to go home, we can cancel the SNF search and have [facility] arranged per [facility].   Assessment/plan status:  Other - See comment Other assessment/ plan:   FL2 and PASARR   Information/referral to community resources:   SNF list    PATIENT'S/FAMILY'S RESPONSE TO PLAN OF CARE: Patient and wife are both agreeable to SNF search- CSW will f/u with offers and to further discuss their wishes tomorrow-    Eduard Clos, MSW, [place]

## 2014-01-15 NOTE — Evaluation (Signed)
Occupational Therapy Evaluation Patient Details Name: Anthony NighGeorge M Clay MRN: [id number] DOB: [birth year] Today's Date: 01/15/2014 Time: 1028-1050 OT Time Calculation (min): 22 min  OT Assessment / Plan / Recommendation History of present illness pt presents with PLIF L2-5 with CSF Leak.  pt surgery 3/4 and on bedrest until 3/8.     Clinical Impression   Pt presents with below problem list. Pt independent with ADLs, PTA. Feel pt will benefit from acute OT to increase independence prior to d/c.     OT Assessment  Patient needs continued OT Services    Follow Up Recommendations  Home health OT;Supervision/Assistance - 24 hour    Barriers to Discharge      Equipment Recommendations  Toilet rise with handles;3 in 1 bedside comode    Recommendations for Other Services    Frequency  Min 2X/week    Precautions / Restrictions Precautions Precautions: Fall;Back Precaution Booklet Issued:  (one in room) Precaution Comments: Pt able to recall 2/3 back precautions. Education on precautions. Restrictions Weight Bearing Restrictions: No   Pertinent Vitals/Pain Pain 5/10. Repositioned.     ADL  Grooming: Wash/dry face;Min guard Where Assessed - Grooming: Supported standing Upper Body Dressing: Set up;Supervision/safety Where Assessed - Upper Body Dressing: Supported sitting Lower Body Dressing: Minimal assistance Where Assessed - Lower Body Dressing: Supported sit to Pharmacist, hospitalstand Toilet Transfer: Minimal assistance;Moderate assistance Toilet Transfer Method: Sit to stand Toilet Transfer Equipment: Regular height toilet Tub/Shower Transfer Method: Not assessed Equipment Used: Gait belt;Rolling walker;Reacher;Long-handled sponge;Long-handled shoe horn;Sock aid Transfers/Ambulation Related to ADLs: Min A for ambulation; Min A/Mod A/Min guard for transfers. ADL Comments: Education provided to pt and spouse. Educated on AE for LB ADLs including toilet aid for hygiene. Educated on use of cups for  teeth care and placement of grooming items to avoid breaking precautions. Educated on elastic shoe laces for shoes. Discussed DME.     OT Diagnosis: Acute pain  OT Problem List: Decreased strength;Impaired balance (sitting and/or standing);Decreased cognition;Decreased knowledge of use of DME or AE;Decreased knowledge of precautions;Pain;Decreased activity tolerance OT Treatment Interventions: Self-care/ADL training;DME and/or AE instruction;Therapeutic activities;Patient/family education;Balance training;Cognitive remediation/compensation   OT Goals(Current goals can be found in the care plan section) Acute Rehab OT Goals Patient Stated Goal: not stated OT Goal Formulation: With patient/family Time For Goal Achievement: 01/22/14 Potential to Achieve Goals: Good ADL Goals Pt Will Perform Grooming: with modified independence;standing Pt Will Transfer to Toilet: with modified independence;ambulating (3 in 1 over commode) Pt Will Perform Toileting - Clothing Manipulation and hygiene: with modified independence;sit to/from stand Pt Will Perform Tub/Shower Transfer: Tub transfer;with supervision;ambulating;3 in 1;rolling walker Additional ADL Goal #1: Pt will independently verbalize and demonstrate 3/3 back precautions.  Visit Information  Last OT Received On: 01/15/14 Assistance Needed: +1 History of Present Illness: pt presents with PLIF L2-5 with CSF Leak.  pt surgery 3/4 and on bedrest until 3/8.         Prior Functioning     Home Living Family/patient expects to be discharged to:: Private residence Living Arrangements: Spouse/significant other Available Help at Discharge: Family;Available 24 hours/day Type of Home: House Home Access: Stairs to enter Entergy CorporationEntrance Stairs-Number of Steps: 1 Home Layout: One level Home Equipment: None Prior Function Level of Independence: Independent Communication Communication: No difficulties Dominant Hand: Right         Vision/Perception      Cognition  Cognition Arousal/Alertness: Awake/alert Behavior During Therapy: WFL for tasks assessed/performed Overall Cognitive Status: Impaired/Different from baseline (pt states he feels off.) Area  of Impairment: Problem solving Problem Solving: Slow processing    Extremity/Trunk Assessment Upper Extremity Assessment Upper Extremity Assessment: Overall WFL for tasks assessed Lower Extremity Assessment Lower Extremity Assessment: Defer to PT evaluation     Mobility Bed Mobility Overal bed mobility: Needs Assistance Bed Mobility: Rolling;Sidelying to Sit Rolling: Supervision (tactile cue) Sidelying to sit: Min assist General bed mobility comments: cues for technique.  Transfers Overall transfer level: Needs assistance Equipment used: Rolling walker (2 wheeled) Transfers: Sit to/from Stand Sit to Stand: Mod assist;Min guard;Min assist Stand pivot transfers: Min assist General transfer comment: Mod A for sit to stand from regular height commode and Min A to control descent to regular height commode. Min guard for sit to stand from bed.     Exercise        End of Session OT - End of Session Equipment Utilized During Treatment: Gait belt;Rolling walker Activity Tolerance: Patient tolerated treatment well Patient left: in chair;with call bell/phone within reach;with family/visitor present Nurse Communication: Mobility status  GO     Earlie Raveling OTR/L [phone] 01/15/2014, 11:57 AM

## 2014-01-15 NOTE — Progress Notes (Signed)
Patient ID: Anthony Clay, male   DOB: 08-31-1928, 80 y.o.   MRN: [id number] BP 134/55  Pulse 81  Temp(Src) 98.6 F (37 C) (Oral)  Resp 20  Ht 5\' 11"  (1.803 m)  Wt 97.4 kg (214 lb 11.7 oz)  BMI 29.96 kg/m2  SpO2 96% Alert, mildly confused.answers all questions. Moving all extremities well Wound is clean,dry, no signs of infection Continues with PT Awaiting placement

## 2014-01-15 NOTE — Clinical Social Work Placement (Signed)
Clinical Social Work Department CLINICAL SOCIAL WORK PLACEMENT NOTE 01/15/2014  Patient:  Ronelle NighROBERTS,Keelan M  Account Number:  [id number] Admit date:  01/10/2014  Clinical Social Worker:  Robin SearingJANET Chloeanne Poteet, LCSWA  Date/time:  01/15/2014 02:14 PM  Clinical Social Work is seeking post-discharge placement for this patient at the following level of care:   SKILLED NURSING   (*CSW will update this form in Epic as items are completed)   01/15/2014  Patient/family provided with [facility] of Clinical Social Work's list of facilities offering this level of care within the geographic area requested by the patient (or if unable, by the patient's family).  01/15/2014  Patient/family informed of their freedom to choose among providers that offer the needed level of care, that participate in Medicare, Medicaid or managed care program needed by the patient, have an available bed and are willing to accept the patient.  01/15/2014  Patient/family informed of [facility]' ownership interest in [facility], as well as of the fact that they are under no obligation to receive care at this facility.  PASARR submitted to EDS on 01/15/2014 PASARR number received from EDS on 01/15/2014  FL2 transmitted to all facilities in geographic area requested by pt/family on  01/15/2014 FL2 transmitted to all facilities within larger geographic area on   Patient informed that his/her managed care company has contracts with or will negotiate with  certain facilities, including the following:     Patient/family informed of bed offers received:   Patient chooses bed at  Physician recommends and patient chooses bed at    Patient to be transferred to  on   Patient to be transferred to facility by   The following physician request were entered in Epic:   Additional Comments: Reece LevyJanet Eleaner Dibartolo, MSW, Theresia MajorsLCSWA [phone]

## 2014-01-15 NOTE — Evaluation (Signed)
Physical Therapy Evaluation Patient Details Name: Ronelle NighGeorge M Mamone MRN: [id number] DOB: [birth year] Today's Date: 01/15/2014 Time: 7829-56210801-0825 PT Time Calculation (min): 24 min  PT Assessment / Plan / Recommendation History of Present Illness  pt rpesents with PLIF L2-5 with CSF Leak.  pt surgery 3/4 and on bedrest until 3/8.    Clinical Impression  Pt generally weak and unsteady with mobility.  Pt at times has delayed response to questions and seems foggy.  Need to ask pt multiple times before he admits to feeling dizzy, though pt clearly looked dizzy.  Pt hopes to D/C to home with wife.  Will continue to follow.      PT Assessment  Patient needs continued PT services    Follow Up Recommendations  Home health PT;Supervision/Assistance - 24 hour    Does the patient have the potential to tolerate intense rehabilitation      Barriers to Discharge        Equipment Recommendations  Rolling walker with 5" wheels;3in1 (PT)    Recommendations for Other Services     Frequency Min 5X/week    Precautions / Restrictions Precautions Precautions: Fall;Back Precaution Booklet Issued: Yes (comment) Precaution Comments: Will need to review back precautions.   Restrictions Weight Bearing Restrictions: No   Pertinent Vitals/Pain 5/10 in low back.        Mobility  Bed Mobility Overal bed mobility: Needs Assistance Bed Mobility: Rolling;Sidelying to Sit Rolling: Min assist Sidelying to sit: Min assist General bed mobility comments: cues for log roll technique.   Transfers Overall transfer level: Needs assistance Equipment used: Rolling walker (2 wheeled) Transfers: Sit to/from UGI CorporationStand;Stand Pivot Transfers Sit to Stand: Min assist Stand pivot transfers: Min assist General transfer comment: cues for safe technique and step-by-step throug task.  pt indicates feeling dizzy with standing, so deferred amb.  RN aware.      Exercises     PT Diagnosis: Difficulty walking;Acute pain  PT  Problem List: Decreased strength;Decreased activity tolerance;Decreased balance;Decreased mobility;Decreased coordination;Decreased cognition;Decreased knowledge of use of DME;Decreased knowledge of precautions;Pain PT Treatment Interventions: DME instruction;Gait training;Stair training;Functional mobility training;Therapeutic activities;Therapeutic exercise;Balance training;Neuromuscular re-education;Patient/family education     PT Goals(Current goals can be found in the care plan section) Acute Rehab PT Goals PT Goal Formulation: With patient Time For Goal Achievement: 01/22/14 Potential to Achieve Goals: Good  Visit Information  Last PT Received On: 01/15/14 Assistance Needed: +1 History of Present Illness: pt rpesents with PLIF L2-5 with CSF Leak.  pt surgery 3/4 and on bedrest until 3/8.         Prior Functioning  Home Living Family/patient expects to be discharged to:: Private residence Living Arrangements: Spouse/significant other Available Help at Discharge: Family;Available 24 hours/day Type of Home: House Home Access: Stairs to enter Entergy CorporationEntrance Stairs-Number of Steps: couple Home Layout: One level Home Equipment: None Additional Comments: pt initially stating lives in 2 story home, then states one story.  May need to clarify home set-up another day.   Prior Function Level of Independence: Independent Communication Communication: No difficulties    Cognition  Cognition Arousal/Alertness: Awake/alert Behavior During Therapy: WFL for tasks assessed/performed Overall Cognitive Status: No family/caregiver present to determine baseline cognitive functioning    Extremity/Trunk Assessment Upper Extremity Assessment Upper Extremity Assessment: Defer to OT evaluation Lower Extremity Assessment Lower Extremity Assessment: Generalized weakness   Balance Balance Overall balance assessment: Needs assistance Standing balance support: Bilateral upper extremity  supported Standing balance-Leahy Scale: Poor Standing balance comment: pt needs RW to maintain balance.  End of Session PT - End of Session Equipment Utilized During Treatment: Gait belt Activity Tolerance: Patient tolerated treatment well Patient left: in chair;with call bell/phone within reach;with chair alarm set Nurse Communication: Mobility status  GP     Sunny Schlein, [place] [phone] 01/15/2014, 8:38 AM

## 2014-01-15 NOTE — Progress Notes (Signed)
UR complete.  Imanii Gosdin RN, MSN 

## 2014-01-15 NOTE — Progress Notes (Signed)
Patient unable to void this shift. In and out cath ( ). Patient still unable to void. Inserted indwelling foley catheter per protocol for urinary retention. Patient still c/o constipation, no BM. Admin PRN miralax.

## 2014-01-16 MED ORDER — FAMOTIDINE 20 MG PO TABS
20.0000 mg | ORAL_TABLET | Freq: Two times a day (BID) | ORAL | Status: DC
  Administered 2014-01-16 – 2014-01-17 (×3): 20 mg via ORAL
  Filled 2014-01-16 (×5): qty 1

## 2014-01-16 NOTE — Progress Notes (Signed)
Occupational Therapy Treatment Patient Details Name: Ronelle NighGeorge M Koopman MRN: [id number] DOB: August 20, 1928 Today's Date: 01/16/2014 Time: [id number] OT Time Calculation (min): 18 min  OT Assessment / Plan / Recommendation  History of present illness pt presents with PLIF L2-5 with CSF Leak.  pt surgery 3/4 and on bedrest until 3/8.     OT comments  Pt progressing towards goals. D/c plan updated to SNF.  Follow Up Recommendations  SNF    Barriers to Discharge       Equipment Recommendations  3 in 1 bedside comode    Recommendations for Other Services    Frequency Min 2X/week   Progress towards OT Goals Progress towards OT goals: Progressing toward goals  Plan Discharge plan needs to be updated    Precautions / Restrictions Precautions Precautions: Fall;Back Precaution Comments: Pt able to state 2/3 precautions. Restrictions Weight Bearing Restrictions: No   Pertinent Vitals/Pain No pain reported.     ADL  Grooming: Teeth care;Min guard Where Assessed - Grooming: Supported standing Lower Body Dressing: Minimal assistance (socks) Where Assessed - Lower Body Dressing: Supported sitting Toilet Transfer: Min Pension scheme managerguard Toilet Transfer Method: Sit to Barist[place]and Toilet Transfer Equipment: Raised toilet seat with arms (or 3-in-1 over toilet) Equipment Used: Gait belt;Rolling walker Transfers/Ambulation Related to ADLs: Min guard ADL Comments: Discussed use of bag on walker. Reviewed using cups for teeth care to avoid bending. OT discussed tub transfer technique. Discussed use of toilet aid if needed for hygiene. Pt able to cross legs for LB dressing, but cues for precautions (wife assisted with rest of sock).    OT Diagnosis:    OT Problem List:   OT Treatment Interventions:     OT Goals(current goals can now be found in the care plan section) Acute Rehab OT Goals Patient Stated Goal: not stated OT Goal Formulation: With patient/family Time For Goal Achievement: 01/22/14 Potential to  Achieve Goals: Good ADL Goals Pt Will Perform Grooming: with modified independence;standing Pt Will Transfer to Toilet: with modified independence;ambulating (3 in 1 over commode) Pt Will Perform Toileting - Clothing Manipulation and hygiene: with modified independence;sit to/from stand Pt Will Perform Tub/Shower Transfer: Tub transfer;with supervision;ambulating;3 in 1;rolling walker Additional ADL Goal #1: Pt will independently verbalize and demonstrate 3/3 back precautions.  Visit Information  Last OT Received On: 01/16/14 Assistance Needed: +1 History of Present Illness: pt presents with PLIF L2-5 with CSF Leak.  pt surgery 3/4 and on bedrest until 3/8.      Subjective Data      Prior Functioning       Cognition  Cognition Arousal/Alertness: Awake/alert Behavior During Therapy: WFL for tasks assessed/performed Overall Cognitive Status: Impaired/Different from baseline Area of Impairment: Orientation;Memory;Attention Orientation Level: Disoriented to;Time Current Attention Level: Selective Memory: Decreased short-term memory;Decreased recall of precautions Problem Solving: Slow processing;Difficulty sequencing;Requires verbal cues;Requires tactile cues General Comments: Still no family present to determine baseline level of cognition.      Mobility  Bed Mobility Overal bed mobility: Needs Assistance Bed Mobility: Sidelying to Sit;Rolling;Sit to Sidelying Rolling: Supervision Sidelying to sit: Supervision Sit to sidelying: Min assist General bed mobility comments: Cues to reinforce technique. Assist to try to keep pt on side before rolling. Transfers Overall transfer level: Needs assistance Equipment used: Rolling walker (2 wheeled) Transfers: Sit to/from Stand Sit to Stand: Min guard General transfer comment: Cues for technique.    Exercises         End of Session OT - End of Session Equipment Utilized During Treatment: Gait  belt;Rolling walker Activity  Tolerance: Patient tolerated treatment well Patient left: in bed;with call bell/phone within reach;with bed alarm set;with family/visitor present  GO     Earlie Raveling OTR/L [phone] 01/16/2014, 10:39 AM

## 2014-01-16 NOTE — Social Work (Signed)
SNF bed offers given to patient and his wife- they have selected [facility]- awaiting MD to advise on d/c date (anticipate today or tomorrow). Patient and wife feel this will be good option for rehab to transition him home at d/c. Spoke with wife in the hall with PT and discussed concerns related to cognitive state which wife feels is leftover anesthesia and not dementia or memory impairment- at home she states he was fine- she voiced concerns related to his speech which again she feels is medication related-  CSW covering will follow to facilitate transfer to SNF when MD ok's- patient and family appreciative of CSW assistance-  Reece LevyJanet Zakara Parkey, MSW, Am[employer] IncLCSWA [phone]

## 2014-01-16 NOTE — Progress Notes (Signed)
Physical Therapy Treatment Patient Details Name: Anthony Clay MRN: [id number] DOB: 07/08/1928 Today's Date: 01/16/2014 Time: [id number] PT Time Calculation (min): 18 min  PT Assessment / Plan / Recommendation  History of Present Illness pt presents with PLIF L2-5 with CSF Leak.  pt surgery 3/4 and on bedrest until 3/8.     PT Comments   Pt able to ambulate today without c/o dizziness.  Noted plan is for pt to D/C to SNF as wife is not capable of caring for pt in this state.  Will continue to follow.    Follow Up Recommendations  SNF     Does the patient have the potential to tolerate intense rehabilitation     Barriers to Discharge        Equipment Recommendations  Rolling walker with 5" wheels;3in1 (PT)    Recommendations for Other Services    Frequency Min 5X/week   Progress towards PT Goals Progress towards PT goals: Progressing toward goals  Plan Discharge plan needs to be updated    Precautions / Restrictions Precautions Precautions: Fall;Back Precaution Comments: Pt able to recall 2/3 back precautions. Education on precautions. Restrictions Weight Bearing Restrictions: No   Pertinent Vitals/Pain "It doesn't really hurt, just itches."      Mobility  Bed Mobility Overal bed mobility: Needs Assistance Bed Mobility: Rolling;Sidelying to Sit Rolling: Supervision Sidelying to sit: Supervision General bed mobility comments: cues for log roll.   Transfers Overall transfer level: Needs assistance Equipment used: Rolling walker (2 wheeled) Transfers: Sit to/from Stand Sit to Stand: Min assist General transfer comment: pt mildly retropulsive with coming to stand and needs MinA for steadying.   Ambulation/Gait Ambulation/Gait assistance: Min assist Ambulation Distance (Feet): 120 Feet Assistive device: Rolling walker (2 wheeled) Gait Pattern/deviations: Step-through pattern;Decreased stride length;Trunk flexed Gait velocity interpretation: Below normal speed for  age/gender General Gait Details: pt needs cues for safe use of RW, upright posture, attending to objects in hallway.  pt needs A with RW management during turns as pt tends to reach out and turn RW leaving his feet outside of RW.      Exercises     PT Diagnosis:    PT Problem List:   PT Treatment Interventions:     PT Goals (current goals can now be found in the care plan section) Acute Rehab PT Goals Patient Stated Goal: not stated Time For Goal Achievement: 01/22/14 Potential to Achieve Goals: Good  Visit Information  Last PT Received On: 01/16/14 Assistance Needed: +1 History of Present Illness: pt presents with PLIF L2-5 with CSF Leak.  pt surgery 3/4 and on bedrest until 3/8.      Subjective Data  Patient Stated Goal: not stated   Cognition  Cognition Arousal/Alertness: Awake/alert Behavior During Therapy: WFL for tasks assessed/performed Overall Cognitive Status: Impaired/Different from baseline Area of Impairment: Orientation;Memory;Attention;Problem solving Orientation Level: Disoriented to;Time Current Attention Level: Selective Memory: Decreased short-term memory Problem Solving: Slow processing;Difficulty sequencing;Requires verbal cues;Requires tactile cues General Comments: Still no family present to determine baseline level of cognition.      Balance  Balance Overall balance assessment: Needs assistance Standing balance support: Bilateral upper extremity supported Standing balance-Leahy Scale: Poor  End of Session PT - End of Session Equipment Utilized During Treatment: Gait belt Activity Tolerance: Patient tolerated treatment well Patient left: in chair;with call bell/phone within reach;with chair alarm set Nurse Communication: Mobility status   GP     Sunny SchleinRitenour, Yahayra Geis F, South CarolinaPT [phone] 01/16/2014, 10:05 AM

## 2014-01-16 NOTE — Progress Notes (Signed)
Patient ID: Anthony Clay, male   DOB: [birth year], 80 y.o.   MRN: [id number] BP 151/78  Pulse 84  Temp(Src) 98 F (36.7 C) (Oral)  Resp 16  Ht 5\' 11"  (1.803 m)  Wt 97.4 kg (214 lb 11.7 oz)  BMI 29.96 kg/m2  SpO2 96% Alert and oriented x 4, mild confusion at times Moving all extremities well Will discontinue foley Possible discharge

## 2014-01-16 NOTE — Clinical Social Work Note (Signed)
[facility]  Vickii PennaGina Westyn Driggers, LCSWA [phone]  Clinical Social Work

## 2014-01-16 NOTE — Progress Notes (Signed)
Pt expresses hate for being here during his ambulation to window at 0130. I asked him what was wrong and he said "this is no life being here, I'm ready to leave". Provided active listening and therapeutic communication. Pt tolerated ambulation well, no other needs expressed. Will continue to monitor.

## 2014-01-16 NOTE — Clinical Documentation Improvement (Signed)
Possible Clinical Conditions?  ________Dural Tear        ____________________ _______Other Condition__________________ _______Cannot Clinically Determine   Supporting Information:  Risk Factors:spondylolisthesis lumbar spondylosis lumbar radiculopathy   Per 01/10/14 operative note: In the course of the decompression an incidental durotomy was made where the dura was very adherent to the ligament and joint space material on the left side at L3.   Thank You, Shelda Palarlene H Cooper ,RN Clinical Documentation Specialist:  [phone]  [facility]- Health Information Management

## 2014-01-17 MED ORDER — OXYCODONE-ACETAMINOPHEN 5-325 MG PO TABS: 1.0000 | ORAL_TABLET | Freq: Four times a day (QID) | ORAL | Status: DC | PRN

## 2014-01-17 MED ORDER — CYCLOBENZAPRINE HCL 10 MG PO TABS: 10.0000 mg | ORAL_TABLET | Freq: Three times a day (TID) | ORAL | Status: DC | PRN

## 2014-01-17 NOTE — Progress Notes (Signed)
Physical Therapy Treatment Patient Details Name: Anthony Clay MRN: [id number] DOB: [birth year] Today's Date: 01/17/2014 Time: [id number] PT Time Calculation (min): 18 min  PT Assessment / Plan / Recommendation  History of Present Illness pt presents with PLIF L2-5 with CSF Leak.  pt surgery 3/4 and on bedrest until 3/8.     PT Comments   Patient agreeable to ambulate however still a fall risk due to decrease safety and inattention. Will continue with current POC  Follow Up Recommendations  SNF     Does the patient have the potential to tolerate intense rehabilitation     Barriers to Discharge        Equipment Recommendations  Rolling walker with 5" wheels;3in1 (PT)    Recommendations for Other Services    Frequency Min 5X/week   Progress towards PT Goals Progress towards PT goals: Progressing toward goals  Plan Current plan remains appropriate    Precautions / Restrictions Precautions Precautions: Fall;Back Precaution Comments: Pt able to state 2/3 precautions. Cues for no twisting   Pertinent Vitals/Pain Denied pain    Mobility  Transfers Overall transfer level: Needs assistance Equipment used: Rolling walker (2 wheeled) Sit to Stand: Min guard General transfer comment: Cues for technique. Minguard for safety and to ensure precautions Ambulation/Gait Ambulation/Gait assistance: Min guard Ambulation Distance (Feet): 300 Feet Assistive device: Rolling walker (2 wheeled) Gait Pattern/deviations: Step-through pattern;Decreased stride length;Trunk flexed General Gait Details: Patient needs continuous cues for safety and positioning of RW and posture. Cues to avoid objects in hallway and to keep feet inside of RW    Exercises     PT Diagnosis:    PT Problem List:   PT Treatment Interventions:     PT Goals (current goals can now be found in the care plan section)    Visit Information  Last PT Received On: 01/17/14 Assistance Needed: +1 History of Present  Illness: pt presents with PLIF L2-5 with CSF Leak.  pt surgery 3/4 and on bedrest until 3/8.      Subjective Data      Cognition  Cognition Arousal/Alertness: Awake/alert Behavior During Therapy: WFL for tasks assessed/performed Overall Cognitive Status: Impaired/Different from baseline Area of Impairment: Orientation;Memory;Attention Orientation Level: Disoriented to;Time Current Attention Level: Selective Memory: Decreased short-term memory;Decreased recall of precautions Problem Solving: Slow processing;Difficulty sequencing;Requires verbal cues;Requires tactile cues General Comments: Still no family present to determine baseline level of cognition.      Balance     End of Session PT - End of Session Equipment Utilized During Treatment: Gait belt Activity Tolerance: Patient tolerated treatment well Patient left: in chair;with call bell/phone within reach;with chair alarm set Nurse Communication: Mobility status   GP     Fredrich BirksRobinette, Julia Elizabeth 01/17/2014, 1:48 PM  01/17/2014 Fredrich Birksobinette, Julia Elizabeth PTA [phone] pager [phone] office

## 2014-01-17 NOTE — Progress Notes (Signed)
Pt A&O x4, pt discharge education and instructions completed with pt; report called off to ScandiaElizabeth at [facility] at 1441. All lines including IV removed on pt. Pt back incision remains dry and intact with no dressing. Incision open to air with sutures intact. Pt don't have a brace ordered by MD. Pt dressed up with spouse assistance. Pt transporting to [facility] via stretcher by [facility]. Pt transported with spouse and belongings at side. Pt refused pain medication prior to his discharge to disposition.

## 2014-01-17 NOTE — Discharge Summary (Signed)
Physician Discharge Summary  Patient ID: Anthony Clay MRN: [id number] DOB/AGE: [birth year] [age] y.o.  Admit date: 01/10/2014 Discharge date: 01/17/2014  Admission Diagnoses: Lumbar spondylosis, lumbar stenosis, lumbar radiculopathy, lumbar scoliosis, lumbar spondylolisthesis  Discharge Diagnoses:  Lumbar spondylosis, lumbar stenosis, lumbar radiculopathy, lumbar scoliosis, lumbar spondylolisthesis Active Problems:   Spondylolisthesis of lumbar region   Discharged Condition: good   Hospital Course: [name] was admitted and taken to the operating room where he underwent a  LUMBAR TWO-THREE,LUMBAR THREE-FOUR, LUMBAR INTERBODY FUSOION WITH INTERBODY PROSTHESIS , local autograft, nuvasive cages Lumbar decompression beyond what was necessary for the PLIF at L2/3, and L3/4, and at L4/5 to decompress the L2,3,4, and L5 roots POSTERIOR LATERAL ARTHRODESIS  L2-L5 morselized autograft  POSTERIOR SEGMENTAL INSTRUMENTATION L2-L5(nuvasive [place]) He did sustain an incidental durotomy during the case, thus he was on bedrest for 3 days. He is ambulating, voiding, and tolerating a regular diet at discharge. He has intermittently been slightly confused. He follows all commands. He is moving all extremities well. His wound is clean, dry, and without signs of infection. He will be discharged to a skilled nursing facility. Consults: None  Significant Diagnostic Studies:   Treatments: surgery: LUMBAR TWO-THREE,LUMBAR THREE-FOUR, LUMBAR INTERBODY FUSOION WITH INTERBODY PROSTHESIS , local autograft, nuvasive cages Lumbar decompression beyond what was necessary for the PLIF at L2/3, and L3/4, and at L4/5 to decompress the L2,3,4, and L5 roots POSTERIOR LATERAL ARTHRODESIS  L2-L5 morselized autograft  POSTERIOR SEGMENTAL INSTRUMENTATION L2-L5(nuvasive [place])   Discharge Exam: Blood pressure 129/73, pulse 79, temperature 98.2 F (36.8 C), temperature source Oral, resp. rate 18, height 5\' 11"  (1.803 m),  weight 97.4 kg (214 lb 11.7 oz), SpO2 97.00%. as above  Disposition: Final discharge disposition not confirmed     Medication List    ASK your doctor about these medications       amLODipine 5 MG tablet  Commonly known as:  NORVASC  Take 5 mg by mouth daily.     brimonidine 0.1 % Soln  Commonly known as:  ALPHAGAN P  Place 1 drop into both eyes at bedtime.     calcium carbonate 600 MG Tabs tablet  Commonly known as:  OS-CAL  Take 600 mg by mouth daily with breakfast.     citalopram 20 MG tablet  Commonly known as:  CELEXA  Take 20 mg by mouth at bedtime.     etodolac 500 MG tablet  Commonly known as:  LODINE  Take 500 mg by mouth 2 (two) times daily.     GLUCOSAMINE 1500 COMPLEX PO  Take 1,500 mg by mouth daily.     IRON PO  Take 1 tablet by mouth daily.     latanoprost 0.005 % ophthalmic solution  Commonly known as:  XALATAN  Place 1 drop into both eyes at bedtime.     multivitamin with minerals Tabs tablet  Take 1 tablet by mouth daily.     valsartan 160 MG tablet  Commonly known as:  DIOVAN  Take 160 mg by mouth at bedtime.           Follow-up Information   Follow up with Amsi Grimley L, MD In 1 month.   Specialty:  Neurosurgery   Contact information:   [address]                         [address] [place] [address] [phone]       Signed: Alicia Ackert L  01/17/2014, 12:47 PM

## 2014-01-17 NOTE — Clinical Social Work Note (Signed)
CSW has faxed discharge summary to [facility]. CS[facility] with admission liaison [facility]. CSW has completed discharge packet and placed on pt's shadow chart. CSW contacted pt's RN to discuss information above. RN to inform pt's family of information above. RN confirmed that CSW can call for transportation. CSW has called for ambulance transportation.  RN please call report to [facility] at [phone]  [facility] [phone]  Darlyn ChamberEmily [place], [facility]WA Clinical Social Worker [phone]

## 2014-01-17 NOTE — Discharge Instructions (Signed)

## 2014-01-17 NOTE — Progress Notes (Signed)
Pt changed his mind and premedicated prior to transportation to his disposition.

## 2014-01-19 ENCOUNTER — Encounter: Payer: Self-pay | Admitting: *Deleted

## 2014-01-22 ENCOUNTER — Encounter: Payer: Self-pay | Admitting: Internal Medicine

## 2014-01-22 ENCOUNTER — Non-Acute Institutional Stay (SKILLED_NURSING_FACILITY): Payer: Medicare Other | Admitting: Internal Medicine

## 2014-01-22 DIAGNOSIS — I10 Essential (primary) hypertension: Secondary | ICD-10-CM

## 2014-01-22 DIAGNOSIS — F411 Generalized anxiety disorder: Secondary | ICD-10-CM

## 2014-01-22 DIAGNOSIS — M4310 Spondylolisthesis, site unspecified: Secondary | ICD-10-CM

## 2014-01-22 DIAGNOSIS — F419 Anxiety disorder, unspecified: Secondary | ICD-10-CM | POA: Insufficient documentation

## 2014-01-22 DIAGNOSIS — M1990 Unspecified osteoarthritis, unspecified site: Secondary | ICD-10-CM | POA: Insufficient documentation

## 2014-01-22 DIAGNOSIS — D62 Acute posthemorrhagic anemia: Secondary | ICD-10-CM

## 2014-01-22 DIAGNOSIS — M4316 Spondylolisthesis, lumbar region: Secondary | ICD-10-CM

## 2014-01-22 NOTE — Assessment & Plan Note (Signed)
Continue norvasc and valsartan

## 2014-01-22 NOTE — Assessment & Plan Note (Signed)
Continue celexa

## 2014-01-22 NOTE — Progress Notes (Signed)
MRN: [id number] Name: Anthony Clay  Sex: male Age: 80 y.o. DOB: 1928/03/20  [name] Facility/Room: 906-2 Level Of Care: SNF Provider: Merrilee SeashoreALEXANDER, Shaena Parkerson D Emergency Contacts: Extended Emergency Contact Information Primary Emergency Contact: [name],[name] Address: [address]          [place], [place] [address] [place] Home Phone: [phone] Work Phone: [phone] Mobile Phone: [phone] Relation: Spouse Secondary Emergency Contact: [name],[name] Address: [address]          [place], [place] [address] [place] Home Phone: [phone] Work Phone: [phone] Mobile Phone: [phone] Relation: Son  Code Status: FULL  Allergies: Review of patient's allergies indicates no known allergies.  Chief Complaint  Patient presents with  . nursing home admission    HPI: Patient is 80 y.o. male who underwnt back surgery and is admitted to SNF for OT/PT.  Past Medical History  Diagnosis Date  . GERD (gastroesophageal reflux disease)     occasional--takes  OTC  . Hypertension   . Anxiety     ?? d/t cortisone shot  . Arthritis     Past Surgical History  Procedure Laterality Date  . Appendectomy    . Hernia repair      bilateral  . Tonsillectomy    . Eye surgery      bilateral cataracts      Medication List       This list is accurate as of: 01/22/14 10:17 PM.  Always use your most recent med list.               amLODipine 5 MG tablet  Commonly known as:  NORVASC  Take 5 mg by mouth daily. For HTN     brimonidine 0.1 % Soln  Commonly known as:  ALPHAGAN P  Place 1 drop into both eyes at bedtime. For glaucoma     calcium carbonate 600 MG Tabs tablet  Commonly known as:  OS-CAL  Take 600 mg by mouth daily with breakfast. For calcium supplement     citalopram 20 MG tablet  Commonly known as:  CELEXA  Take 20 mg by mouth at bedtime. For treatment of depression     etodolac 500 MG tablet   Commonly known as:  LODINE  Take 500 mg by mouth 2 (two) times daily. For treatment of pain     GLUCOSAMINE 1500 COMPLEX PO  Take 1,500 mg by mouth daily. For supplement     IRON PO  Take 1 tablet by mouth daily.     latanoprost 0.005 % ophthalmic solution  Commonly known as:  XALATAN  Place 1 drop into both eyes at bedtime. For control of glaucoma progression     multivitamin with minerals Tabs tablet  Take 1 tablet by mouth daily. For supplement     oxyCODONE-acetaminophen 5-325 MG per tablet  Commonly known as:  ROXICET  Take 1 tablet by mouth every 6 (six) hours as needed for severe pain.     valsartan 160 MG tablet  Commonly known as:  DIOVAN  Take 160 mg by mouth at bedtime. For treatment of HTN        No orders of the defined types were placed in this encounter.     There is no immunization history on file for this patient.  History  Substance Use Topics  . Smoking status: Former Smoker -- 0.50 packs/day for 10 years    Types: Cigarettes    Quit date: 05/01/1978  .  Smokeless tobacco: Not on file  . Alcohol Use: 1.8 oz/week    3 Glasses of wine per week     Comment: moderate consumption    Family history is noncontributory    Review of Systems  DATA OBTAINED: from patient GENERAL: Feels well no fevers, fatigue, appetite changes SKIN: No itching, rash or wounds EYES: No eye pain, redness, discharge EARS: No earache, tinnitus, change in hearing NOSE: No congestion, drainage or bleeding  MOUTH/THROAT: No mouth or tooth pain, No sore throat, No difficulty chewing or swallowing  RESPIRATORY: No cough, wheezing, SOB CARDIAC: No chest pain, palpitations, lower extremity edema  GI: No abdominal pain, No N/V/D or constipation, No heartburn or reflux  GU: No dysuria, frequency or urgency, or incontinence  MUSCULOSKELETAL: No unrelieved bone/joint pain NEUROLOGIC: No headache, dizziness or focal weakness PSYCHIATRIC: No overt anxiety or sadness. Sleeps well.  No behavior issue.   Filed Vitals:   01/22/14 2151  BP: 129/78  Pulse: 75  Temp: 97.1 F (36.2 C)  Resp: 18    Physical Exam  GENERAL APPEARANCE: Alert, conversant. Appropriately groomed. No acute distress.  SKIN: No diaphoresis rash; back incision with sutures in place, healing without infection HEAD: Normocephalic, atraumatic  EYES: Conjunctiva/lids clear. Pupils round, reactive. EOMs intact.  EARS: External exam WNL, canals clear. Hearing grossly normal.  NOSE: No deformity or discharge.  MOUTH/THROAT: Lips w/o lesions  RESPIRATORY: Breathing is even, unlabored. Lung sounds are clear   CARDIOVASCULAR: Heart RRR no murmurs, rubs or gallops. No peripheral edema.  GASTROINTESTINAL: Abdomen is soft, non-tender, not distended w/ normal bowel sounds GENITOURINARY: Bladder non tender, not distended  MUSCULOSKELETAL: No abnormal joints or musculature NEUROLOGIC: Oriented X3. Cranial nerves 2-12 grossly intact. Moves all extremities no tremor. PSYCHIATRIC: Mood and affect appropriate to situation, no behavioral issues  Patient Active Problem List   Diagnosis Date Noted  . Acute blood loss anemia 01/22/2014  . Hypertension   . Anxiety   . Arthritis   . Spondylolisthesis of lumbar region 01/10/2014    CBC    Component Value Date/Time   WBC 6.5 01/01/2014 0841   RBC 4.12* 01/01/2014 0841   HGB 10.5* 01/10/2014 1651   HCT 31.0* 01/10/2014 1651   PLT 229 01/01/2014 0841   MCV 90.8 01/01/2014 0841    CMP     Component Value Date/Time   NA 140 01/10/2014 1651   K 4.6 01/10/2014 1651   CL 101 01/01/2014 0841   CO2 26 01/01/2014 0841   GLUCOSE 152* 01/10/2014 1651   BUN 28* 01/01/2014 0841   CREATININE 1.06 01/01/2014 0841   CALCIUM 9.4 01/01/2014 0841   GFRNONAA 63* 01/01/2014 0841   GFRAA 73* 01/01/2014 0841    Assessment and Plan  Spondylolisthesis of lumbar region S/p interbody prosthesis to L2-3,L3-4 with posterior lateral arthrodesis L2-5 autograph and nuvasive cage posterior  L2-5; pt is admitted for OT/PT  Hypertension Continue norvasc and valsartan  Anxiety Continue celexa  Acute blood loss anemia Continue ferrous sulfate    Margit Hanks, MD

## 2014-01-22 NOTE — Assessment & Plan Note (Signed)
S/p interbody prosthesis to L2-3,L3-4 with posterior lateral arthrodesis L2-5 autograph and nuvasive cage posterior L2-5; pt is admitted for OT/PT

## 2014-01-22 NOTE — Assessment & Plan Note (Signed)
-   Continue ferrous sulfate 

## 2014-02-02 ENCOUNTER — Non-Acute Institutional Stay (SKILLED_NURSING_FACILITY): Payer: Medicare Other | Admitting: Adult Health

## 2014-02-02 DIAGNOSIS — F419 Anxiety disorder, unspecified: Secondary | ICD-10-CM

## 2014-02-02 DIAGNOSIS — M4310 Spondylolisthesis, site unspecified: Secondary | ICD-10-CM

## 2014-02-02 DIAGNOSIS — F411 Generalized anxiety disorder: Secondary | ICD-10-CM

## 2014-02-02 DIAGNOSIS — M1990 Unspecified osteoarthritis, unspecified site: Secondary | ICD-10-CM

## 2014-02-02 DIAGNOSIS — I10 Essential (primary) hypertension: Secondary | ICD-10-CM

## 2014-02-02 DIAGNOSIS — M4316 Spondylolisthesis, lumbar region: Secondary | ICD-10-CM

## 2014-02-02 DIAGNOSIS — M129 Arthropathy, unspecified: Secondary | ICD-10-CM

## 2014-02-02 DIAGNOSIS — D62 Acute posthemorrhagic anemia: Secondary | ICD-10-CM

## 2014-02-06 ENCOUNTER — Encounter: Payer: Self-pay | Admitting: Adult Health

## 2014-02-06 NOTE — Progress Notes (Signed)
Patient ID: Anthony Clay, male   DOB: 18-Mar-1928, 80 y.o.   MRN: [id number]              PROGRESS NOTE  DATE: 02/02/2014   [facility]  LEVEL OF CARE: SNF (31)  Acute Visit  CHIEF COMPLAINT:  Discharge Notes  HISTORY OF PRESENT ILLNESS: This is an 80 year old male who is for discharge home to outpatient care. DME: Rolling walker. [facility] with Spondylolisthesis of lumbar region S/p interbody prosthesis to L2-3,L3-4 with posterior lateral arthrodesis L2-5 autograph and nuvasive cage posterior L2-5. Patient was admitted to this facility for short-term rehabilitation after the patient's recent hospitalization.  Patient has completed SNF rehabilitation and therapy has cleared the patient for discharge.  Reassessment of ongoing problem(s):  HTN: Pt 's HTN remains stable.  Denies CP, sob, DOE, pedal edema, headaches, dizziness or visual disturbances.  No complications from the medications currently being used.  Last BP : 134/75  ANXIETY: The anxiety remains stable. Patient denies ongoing anxiety or irritability. No complications reported from the medications currently being used.  ANEMIA: The anemia has been stable. The patient denies fatigue, melena or hematochezia. No complications from the medications currently being used.  PAST MEDICAL HISTORY : Reviewed.  No changes.  CURRENT MEDICATIONS: [facility]  REVIEW OF SYSTEMS:  GENERAL: no change in appetite, no fatigue, no weight changes, no fever, chills or weakness RESPIRATORY: no cough, SOB, DOE, wheezing, hemoptysis CARDIAC: no chest pain, edema or palpitations GI: no abdominal pain, diarrhea, constipation, heart burn, nausea or vomiting  PHYSICAL EXAMINATION  GENERAL: no acute distress, normal body habitus EYES: conjunctivae normal, sclerae normal, normal eye lids NECK: supple, trachea midline, no neck masses, no thyroid  tenderness, no thyromegaly LYMPHATICS: no LAN in the neck, no supraclavicular LAN RESPIRATORY: breathing is even & unlabored, BS CTAB CARDIAC: RRR, no murmur,no extra heart sounds, no edema GI: abdomen soft, normal BS, no masses, no tenderness, no hepatomegaly, no splenomegaly PSYCHIATRIC: the patient is alert & oriented to person, affect & behavior appropriate  LABS/RADIOLOGY: Labs reviewed: Basic Metabolic Panel:  Recent Labs  40/98/1102/23/15 0841 01/10/14 1420 01/10/14 1651  NA 139 140 140  K 4.9 4.4 4.6  CL 101  --   --   CO2 26  --   --   GLUCOSE 94 141* 152*  BUN 28*  --   --   CREATININE 1.06  --   --   CALCIUM 9.4  --   --    CBC:  Recent Labs  01/01/14 0841 01/10/14 1420 01/10/14 1651  WBC 6.5  --   --   HGB 12.5* 9.5* 10.5*  HCT 37.4* 28.0* 31.0*  MCV 90.8  --   --   PLT 229  --   --      ASSESSMENT/PLAN:  Spondylolisthesis of lumbar region S/p interbody prosthesis to L2-3,L3-4 with posterior lateral arthrodesis L2-5 autograph and nuvasive cage posterior L2-5. - for outpatient care Hypertension - well controlled; continue Cozaar and amlodipine Anxiety - continue Celexa Osteoarthritis - continue Lodine Anemia, acute blood dose -  Continue Iron supplementation  I have filled out patient's discharge paperwork and written prescriptions.  Patient will have outpatient care. DME provided: Rolling walker  Total discharge time: Greater than 30 minutes Discharge time involved coordination of the discharge process with Child psychotherapistsocial worker, nursing staff and therapy department. Medical justification for home health services/DME verified.  CPT CODE: 25366  Ella Bodo - NP [facility] [phone]

## 2015-10-03 IMAGING — DX DG LUMBAR SPINE 1V
1 series · 1 of 1 positions shown · non-contrast
Comparison: 01/10/2014 at [DATE] a.m.

CLINICAL DATA: Operative localization image for lumbar spine
surgery.

EXAM:
LUMBAR SPINE - 1 VIEW

[lat]
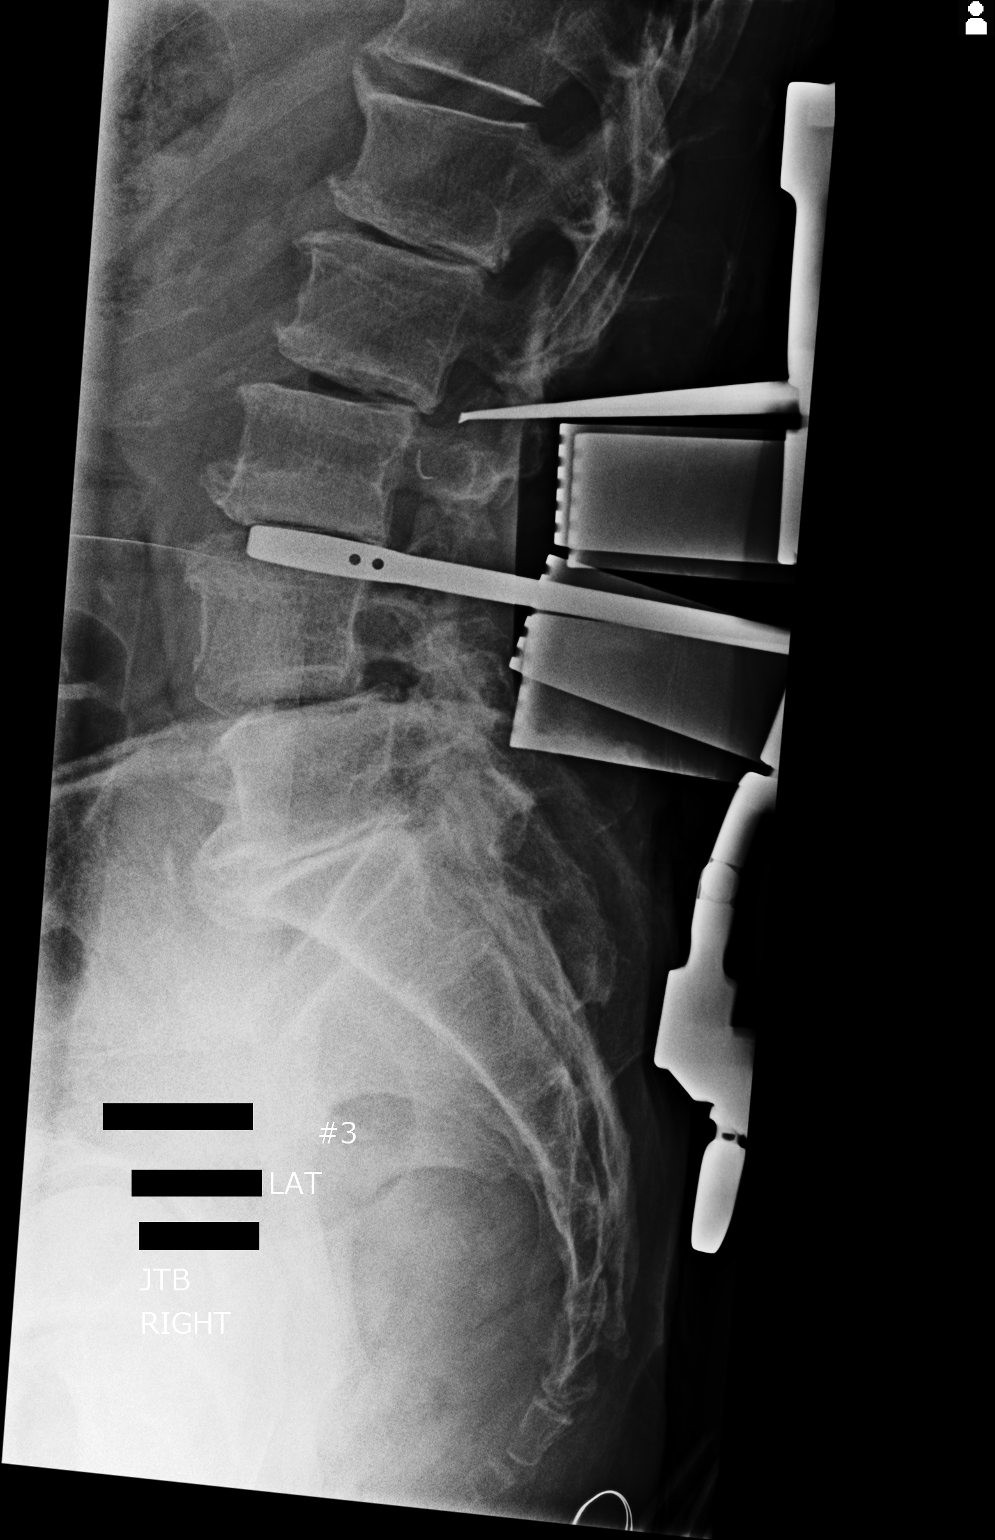

[1 of 1 positions shown; findings below may reference images not displayed]

FINDINGS: A surgical instrument has been inserted between skin retractors from
a posterior approach. Its tip lies superimposed over the central
aspect of the L3-L4 disc space. Another probe has its tip posterior
to the L2-L3 disc interspace. Disc degenerative changes are again
noted as is a grade 1 anterolisthesis of L4 on L5.
IMPRESSION: Surgical localization image as detailed.

## 2017-10-08 ENCOUNTER — Encounter: Payer: Self-pay | Admitting: Neurology

## 2017-10-08 ENCOUNTER — Ambulatory Visit (INDEPENDENT_AMBULATORY_CARE_PROVIDER_SITE_OTHER): Payer: Medicare Other | Admitting: Neurology

## 2017-10-08 VITALS — BP 143/84 | HR 77 | Ht 71.0 in | Wt 201.4 lb

## 2017-10-08 DIAGNOSIS — M792 Neuralgia and neuritis, unspecified: Secondary | ICD-10-CM | POA: Diagnosis not present

## 2017-10-08 DIAGNOSIS — G501 Atypical facial pain: Secondary | ICD-10-CM

## 2017-10-08 DIAGNOSIS — R51 Headache: Secondary | ICD-10-CM | POA: Diagnosis not present

## 2017-10-08 DIAGNOSIS — R519 Headache, unspecified: Secondary | ICD-10-CM

## 2017-10-08 NOTE — Patient Instructions (Signed)
Cluster Headache A cluster headache is a type of headache that causes deep, intense head pain. Cluster headaches can last from 15 minutes to 3 hours. They usually occur:  On one side of the head. They may occur on the other side when a new cluster of headaches begins.  Repeatedly over weeks to months.  Several times a day.  At the same time of day, often at night.  More often in the fall and springtime.  What are the causes? The cause of this condition is not known. What increases the risk? This condition is more likely to develop in:  Males.  People who drink alcohol.  People who smoke or use products that contain nicotine or tobacco.  People who take medicines that cause blood vessels to expand, such as nitroglycerin.  People who take antihistamines.  What are the signs or symptoms? Symptoms of this condition include:  Severe pain on one side of the head that begins behind or around your eye or temple.  Pain on one side of the head.  Nausea.  Sensitivity to light.  Runny nose and nasal stuffiness.  Sweaty, pale skin on the face.  Droopy or swollen eyelid, eye redness, or tearing.  Restlessness and agitation.  How is this diagnosed? This condition may be diagnosed based on:  Your symptoms.  A physical exam.  Your health care provider may order tests to see if your headaches are caused by another medical condition. These tests may show that you do not have cluster headaches. Tests may include:  A CT scan of your head.  An MRI of your head.  Lab tests.  How is this treated? This condition may be treated with:  Medicines to relieve pain and to prevent repeated (recurrent) attacks. Some people may need a combination of medicines.  Oxygen. This helps to relieve pain.  Follow these instructions at home: Headache diary Keep a headache diary as told by your health care provider. Doing this can help you and your health care provider figure out what  triggers your headaches. In your headache diary, include information about:  The time of day that your headache started and what you were doing when it began.  How long your headache lasted.  Where your pain started and whether it moved to other areas.  The type of pain, such as burning, stabbing, throbbing, or cramping.  Your level of pain. Use a pain scale and rate the pain with a number from 1 (mild) up to 10 (severe).  The treatment that you used, and any change in symptoms after treatment.  Medicines  Take over-the-counter and prescription medicines only as told by your health care provider.  Do not drive or use heavy machinery while taking prescription pain medicine.  Use oxygen as told by your health care provider. Lifestyle  Follow a regular sleep schedule. Do not vary the time that you go to bed or the amount that you sleep from day to day. It is important to stay on the same schedule during a cluster period to help prevent headaches.  Exercise regularly.  Eat a healthy diet and avoid foods that may trigger your headaches.  Avoid alcohol.  Do not use any products that contain nicotine or tobacco, such as cigarettes and e-cigarettes. If you need help quitting, ask your health care provider. Contact a health care provider if:  Your headaches change, become more severe, or occur more often.  The medicine or oxygen that your health care provider recommended  does not help. Get help right away if:  You faint.  You have weakness or numbness, especially on one side of your body or face.  You have double vision.  You have nausea or vomiting that does not go away within several hours.  You have trouble talking, walking, or keeping your balance.  You have pain or stiffness in your neck.  You have a fever. Summary  A cluster headache is a type of headache that causes deep, intense head pain, usually on one side of the head.  Keep a headache diary to help discover  what triggers your headaches.  A regular sleep schedule can help prevent headaches. This information is not intended to replace advice given to you by your health care provider. Make sure you discuss any questions you have with your health care provider. Document Released: 10/26/2005 Document Revised: 07/07/2016 Document Reviewed: 07/07/2016 Elsevier Interactive Patient Education  2018 [employer].   Trigeminal Neuralgia Trigeminal neuralgia is a nerve disorder that causes attacks of severe facial pain. The attacks last from a few seconds to several minutes. They can happen for days, weeks, or months and then go away for months or years. Trigeminal neuralgia is also called tic douloureux. What are the causes? This condition is caused by damage to a nerve in the face that is called the trigeminal nerve. An attack can be triggered by:  Talking.  Chewing.  Putting on makeup.  Washing your face.  Shaving your face.  Brushing your teeth.  Touching your face.  What increases the risk? This condition is more likely to develop in:  Women.  People who are 60 years of age or older.  What are the signs or symptoms? The main symptom of this condition is pain in the jaw, lips, eyes, nose, scalp, forehead, and face. The pain may be intense, stabbing, electric, or shock-like. How is this diagnosed? This condition is diagnosed with a physical exam. A CT scan or MRI may be done to rule out other conditions that can cause facial pain. How is this treated? This condition may be treated with:  Avoiding the things that trigger your attacks.  Pain medicine.  Surgery. This may be done in severe cases if other medical treatment does not provide relief.  Follow these instructions at home:  Take over-the-counter and prescription medicines only as told by your health care provider.  If you wish to get pregnant, talk with your health care provider before you start trying to get  pregnant.  Avoid the things that trigger your attacks. It may help to: ? Chew on the unaffected side of your mouth. ? Avoid touching your face. ? Avoid blasts of hot or cold air. Contact a health care provider if:  Your pain medicine is not helping.  You develop new, unexplained symptoms, such as: ? Double vision. ? Facial weakness. ? Changes in hearing or balance.  You become pregnant. Get help right away if:  Your pain is unbearable, and your pain medicine does not help. This information is not intended to replace advice given to you by your health care provider. Make sure you discuss any questions you have with your health care provider. Document Released: 10/23/2000 Document Revised: 06/28/2016 Document Reviewed: 02/18/2015 Elsevier Interactive Patient Education  2018 [employer] Inc.    Occipital Neuralgia Occipital neuralgia is a type of headache that causes episodes of very bad pain in the back of your head. Pain from occipital neuralgia may spread (radiate) to other parts of  your head. The pain is usually brief and often goes away after you rest and relax. These headaches may be caused by irritation of the nerves that leave your spinal cord high up in your neck, just below the base of your skull (occipital nerves). Your occipital nerves transmit sensations from the back of your head, the top of your head, and the areas behind your ears. What are the causes? Occipital neuralgia can occur without any known cause (primary headache syndrome). In other cases, occipital neuralgia is caused by pressure on or irritation of one of the two occipital nerves. Causes of occipital nerve compression or irritation include:  Wear and tear of the vertebrae in the neck (osteoarthritis).  Neck injury.  Disease of the disks that separate the vertebrae.  Tumors.  Gout.  Infections.  Diabetes.  Swollen blood vessels that put pressure on the occipital nerves.  Muscle spasm in the  neck.  What are the signs or symptoms? Pain is the main symptom of occipital neuralgia. It usually starts in the back of the head but may also be felt in other areas supplied by the occipital nerves. Pain is usually on one side but may be on both sides. You may have:  Brief episodes of very bad pain that is burning, stabbing, shocking, or shooting.  Pain behind the eye.  Pain triggered by neck movement or hair brushing.  Scalp tenderness.  Aching in the back of the head between episodes of very bad pain.  How is this diagnosed? Your health care provider may diagnose occipital neuralgia based on your symptoms and a physical exam. During the exam, the health care provider may push on areas supplied by the occipital nerves to see if they are painful. Some tests may also be done to help in making the diagnosis. These may include:  Imaging studies of the upper spinal cord, such as an MRI or CT scan. These may show compression or spinal cord abnormalities.  Nerve block. You will get an injection of numbing medicine (local anesthetic) near the occipital nerve to see if this relieves pain.  How is this treated? Treatment may begin with simple measures, such as:  Rest.  Massage.  Heat.  Over-the-counter pain relievers.  If these measures do not work, you may need other treatments, including:  Medicines such as: ? Prescription-strength anti-inflammatory medicines. ? Muscle relaxants. ? Antiseizure medicines. ? Antidepressants.  Steroid injection. This involves injections of local anesthetic and strong anti-inflammatory drugs (steroids).  Pulsed radiofrequency. Wires are implanted to deliver electrical impulses that block pain signals from the occipital nerve.  Physical therapy.  Surgery to relieve nerve pressure.  Follow these instructions at home:  Take all medicines as directed by your health care provider.  Avoid activities that cause pain.  Rest when you have an  attack of pain.  Try gentle massage or a heating pad to relieve pain.  Work with a physical therapist to learn stretching exercises you can do at home.  Try a different pillow or sleeping position.  Practice good posture.  Try to stay active. Get regular exercise that does not cause pain. Ask your health care provider to suggest safe exercises for you.  Keep all follow-up visits as directed by your health care provider. This is important. Contact a health care provider if:  Your medicine is not working.  You have new or worsening symptoms. Get help right away if:  You have very bad head pain that is not going away.  You  have a sudden change in vision, balance, or speech. This information is not intended to replace advice given to you by your health care provider. Make sure you discuss any questions you have with your health care provider. Document Released: 10/20/2001 Document Revised: 04/02/2016 Document Reviewed: 10/18/2013 Elsevier Interactive Patient Education  2017 [employer]

## 2017-10-08 NOTE — Progress Notes (Signed)
[facility]    Provider:  Dr Lucia GaskinsAhern Referring Provider: Roger KillWilliams, Breejante J, * Primary Care Physician:  Richmond CampbellKaplan, Kristen W., PA-C  CC:  Intractable headache  HPI:  Anthony Clay is a 83 y.o. male here as a referral from Dr. Mayford KnifeWilliams for intractable headache.  Past medical history arthritis, hypertension, anxiety, lumbago, megaloblastic anemia, osteoarthritis, easy fatigability, spondylolisthesis of the lumbar spine.  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 Acute onset, just sitting there, no head pain, nothing unusual that evening, right side of scalp, it was severe, quick, short, severe, electrical pain, shooting, never had it before. Lasted off an on through the next morning. Recurring every 2-3 minutes. No tearing of the eye, no nose running, swelling, no lesions or rash or blister. No hx of migraines. Ibuprofen resolved.   Reviewed notes, labs and imaging from outside physicians, which showed:  Reviewed primary care notes.  Patient accompanied with his wife.  Complained of a sharp shooting pain in the right parietal region that started acutely.  Pain started suddenly while he was sitting and watching television.  It lingered for 2-3 minutes and then kept coming and going every 1-2 minutes.  Pain lasts for a second.  It had not moved and does not radiate.  Never had a similar pain before.  Nothing makes the pain better or worse.  Patient treated with Tylenol with no relief.  Denies any associated symptoms, inciting events, head injury, increased chest pain, stress, palpitations or history of stroke.  He was instructed to take ibuprofen with food up to 3 times daily.  Review of Systems: Patient complains of symptoms per HPI as well as the following symptoms: headache. Pertinent negatives and positives per HPI. All others negative.   Social History   Socioeconomic History  . Marital status: Married    Spouse  name: Anthony Clay  . Number of children: 3  . Years of education: Not on file  . Highest education level: Master's degree (e.g., MA, MS, MEng, MEd, MSW, MBA)  Social Needs  . Financial resource strain: Not on file  . Food insecurity - worry: Not on file  . Food insecurity - inability: Not on file  . Transportation needs - medical: Not on file  . Transportation needs - non-medical: Not on file  Occupational History  . Not on file  Tobacco Use  . Smoking status: Former Smoker    Packs/day: 0.50    Years: 10.00    Pack years: 5.00    Types: Cigarettes    Last attempt to quit: 05/01/1978    Years since quitting: 39.4  . Smokeless tobacco: Never Used  Substance and Sexual Activity  . Alcohol use: Yes    Alcohol/week: 1.8 oz    Types: 3 Glasses of wine per week    Comment: moderate consumption  . Drug use: No  . Sexual activity: Not on file  Other Topics Concern  . Not on file  Social History Narrative   Lives at home with his wife   Right handed   Drinks about 3 cups of caffeine daily    Family History  Problem Relation Age of Onset  . Neuropathy Neg Hx     Past Medical History:  Diagnosis Date  . Anxiety    ?? d/t cortisone shot  . Arthritis   . Chronic low back pain   . GERD (gastroesophageal reflux disease)    occasional--takes  OTC  .  Hypertension     Past Surgical History:  Procedure Laterality Date  . APPENDECTOMY    . BACK SURGERY  2015   L 3-5 fusion with cages and screws  . EYE SURGERY     bilateral cataracts  . HERNIA REPAIR     bilateral  . ROTATOR CUFF REPAIR Left 2012  . TONSILLECTOMY      Current Outpatient Medications  Medication Sig Dispense Refill  . amLODipine (NORVASC) 5 MG tablet Take 5 mg by mouth daily. For HTN    . bimatoprost (LUMIGAN) 0.01 % SOLN Place 1 drop into both eyes at bedtime.    . calcium carbonate (OS-CAL) 600 MG TABS tablet Take 600 mg by mouth daily with breakfast. For calcium supplement    . IRON PO Take 1 tablet by  mouth daily.    Marland Kitchen losartan (COZAAR) 100 MG tablet Take 100 mg by mouth daily.    . Multiple Vitamin (MULTIVITAMIN WITH MINERALS) TABS tablet Take 1 tablet by mouth daily. For supplement    . timolol (TIMOPTIC) 0.5 % ophthalmic solution Place 1 drop into both eyes every morning.     No current facility-administered medications for this visit.     Allergies as of 10/08/2017  . (No Known Allergies)    Vitals: BP (!) 143/84 (BP Location: Right Arm, Patient Position: Sitting)   Pulse 77   Ht 5\' 11"  (1.803 m)   Wt 201 lb 6.4 oz (91.4 kg)   BMI 28.09 kg/m  Last Weight:  Wt Readings from Last 1 Encounters:  10/08/17 201 lb 6.4 oz (91.4 kg)   Last Height:   Ht Readings from Last 1 Encounters:  10/08/17 5\' 11"  (1.803 m)   Physical exam: Exam: Gen: NAD, conversant, well nourised, obese, well groomed                     CV: RRR, no MRG. No Carotid Bruits. No peripheral edema, warm, nontender Eyes: Conjunctivae clear without exudates or hemorrhage  Neuro: Detailed Neurologic Exam  Speech:    Speech is normal; fluent and spontaneous with normal comprehension.  Cognition:    The patient is oriented to person, place, and time;     recent and remote memory intact;     language fluent;     normal attention, concentration,     fund of knowledge Cranial Nerves:    The pupils are equal, round, and reactive to light. The fundi are normal and spontaneous venous pulsations are present. Visual fields are full to finger confrontation. Extraocular movements are intact. Trigeminal sensation is intact and the muscles of mastication are normal. The face is symmetric. The palate elevates in the midline. Hearing intact. Voice is normal. Shoulder shrug is normal. The tongue has normal motion without fasciculations.   Coordination:    Normal finger to nose and heel to shin. Normal rapid alternating movements.   Gait:    Heel-toe and tandem gait are normal.   Motor Observation:    No asymmetry, no  atrophy, and no involuntary movements noted. Tone:    Normal muscle tone.    Posture:    Posture is normal. normal erect    Strength:    Strength is V/V in the upper and lower limbs.      Sensation: intact to LT     Reflex Exam:  DTR's:    Deep tendon reflexes in the upper and lower extremities are normal bilaterally.   Toes:    The toes  are downgoing bilaterally.   Clonus:    Clonus is absent.       Assessment/Plan: This is an 83 year old male here with intractable headaches.  He reports drug use with methamphetamines and is a former smoker, drinks at least 14 glasses of wine per week.  He had acute onset of right sided head pain which was severe, quick, electrical and shooting, newonset headache after the age of 83.   Will order an MRI of the brain to evaluate for any intracerebral causes of patient's acute neuralgia including strokes, space-occupying masses, compressive tumors or any other lesions.  We will also order labs today.  Discussed alcohol and drug use, recommend no more than 1-2 drinks per day.  Orders Placed This Encounter  Procedures  . MR BRAIN W WO CONTRAST  . Sedimentation rate  . C-reactive protein  . Basic Metabolic Panel    Cc: Karmen StabsKristin Kaplan PA  Naomie DeanAntonia Ahern, MD  [facility] [address] [place], [place] [id number]  Phone [phone] Fax [phone]

## 2017-10-09 LAB — BASIC METABOLIC PANEL
BUN / CREAT RATIO: 12 (ref 10–24)
BUN: 16 mg/dL (ref 8–27)
CHLORIDE: 101 mmol/L (ref 96–106)
CO2: 25 mmol/L (ref 20–29)
Calcium: 9.5 mg/dL (ref 8.6–10.2)
Creatinine, Ser: 1.32 mg/dL — ABNORMAL HIGH (ref 0.76–1.27)
GFR calc Af Amer: 56 mL/min/{1.73_m2} — ABNORMAL LOW (ref >59)
GFR, EST NON AFRICAN AMERICAN: 48 mL/min/{1.73_m2} — AB (ref >59)
Glucose: 90 mg/dL (ref 65–99)
Potassium: 4.6 mmol/L (ref 3.5–5.2)
SODIUM: 141 mmol/L (ref 134–144)

## 2017-10-09 LAB — SEDIMENTATION RATE: Sed Rate: 4 mm/hr (ref 0–30)

## 2017-10-09 LAB — C-REACTIVE PROTEIN: CRP: 2.1 mg/L (ref 0.0–4.9)

## 2017-10-11 ENCOUNTER — Telehealth: Payer: Self-pay | Admitting: *Deleted

## 2017-10-11 ENCOUNTER — Encounter: Payer: Self-pay | Admitting: Neurology

## 2017-10-11 DIAGNOSIS — R51 Headache: Secondary | ICD-10-CM

## 2017-10-11 DIAGNOSIS — R519 Headache, unspecified: Secondary | ICD-10-CM | POA: Insufficient documentation

## 2017-10-11 NOTE — Telephone Encounter (Addendum)
Called patient, he verbalized understanding that his creatinine is elevated. He does recall any h/o of elevated creatinine. He will call his PCP to f/u. I have routed the lab results to his PCP, Mady GemmaKristen Kaplan PA for her review. Patient also states he is expected a call regarding his MRI scheduling. He will call back mid-week if he has not heard anything. He verbalized understanding and appreciation for the call.     ----- Message from Anson FretAntonia B Ahern, MD sent at 10/10/2017  7:26 PM EST ----- His creatinine is elevated, I don;t think I have CKD in his past medical history. Let him know and if he doesn;t have a history of elevated creatinine he should follow up with pcp thanks

## 2017-10-25 ENCOUNTER — Ambulatory Visit
Admission: RE | Admit: 2017-10-25 | Discharge: 2017-10-25 | Disposition: A | Discharge status: Home / Self Care | Payer: Medicare Other | Source: Ambulatory Visit | Attending: Neurology | Admitting: Neurology

## 2017-10-25 DIAGNOSIS — R51 Headache: Principal | ICD-10-CM

## 2017-10-25 DIAGNOSIS — M792 Neuralgia and neuritis, unspecified: Secondary | ICD-10-CM

## 2017-10-25 DIAGNOSIS — R519 Headache, unspecified: Secondary | ICD-10-CM

## 2017-10-25 DIAGNOSIS — G501 Atypical facial pain: Secondary | ICD-10-CM

## 2017-10-25 MED ORDER — GADOBENATE DIMEGLUMINE 529 MG/ML IV SOLN: 18.0000 mL | Freq: Once | INTRAVENOUS | Status: DC | PRN

## 2017-10-29 ENCOUNTER — Telehealth: Payer: Self-pay | Admitting: *Deleted

## 2017-10-29 NOTE — Telephone Encounter (Addendum)
Called and spoke with the patient. I informed him of his MRI results listed below. He verbalized understanding that his MRI was negative for masses, lesions, strokes or any other cause for his headaches. We did see some moderate atrophy which can be related to a few different things such as normal aging, memory loss or dementia, other disorders of the brain, but Dr. Lucia GaskinsAhern feels his may correlate with age. He had no questions and verbalized appreciation for the call.    ----- Message from Anson FretAntonia B Ahern, MD sent at 10/28/2017  6:07 PM EST ----- No masses, lesions, strokes or any other etiology for his headache. There was atrophy in the brain which is non specific and can be seen in many conditions including normal aging but also with memory loss or dementia, psychiatric disorders, drug and alcohol abuse and others reasons. His was moderate which may correlate with his age.

## 2018-02-08 ENCOUNTER — Other Ambulatory Visit: Payer: Self-pay | Admitting: Family Medicine

## 2018-02-08 ENCOUNTER — Ambulatory Visit
Admission: RE | Admit: 2018-02-08 | Discharge: 2018-02-08 | Disposition: A | Discharge status: Home / Self Care | Payer: Medicare Other | Source: Ambulatory Visit | Attending: Family Medicine | Admitting: Family Medicine

## 2018-02-08 DIAGNOSIS — R0602 Shortness of breath: Secondary | ICD-10-CM

## 2018-10-11 ENCOUNTER — Encounter: Payer: Self-pay | Admitting: Neurology

## 2018-10-11 ENCOUNTER — Ambulatory Visit (INDEPENDENT_AMBULATORY_CARE_PROVIDER_SITE_OTHER): Payer: Medicare Other | Admitting: Neurology

## 2018-10-11 VITALS — BP 130/80 | HR 68 | Ht 71.0 in | Wt 198.0 lb

## 2018-10-11 DIAGNOSIS — G5603 Carpal tunnel syndrome, bilateral upper limbs: Secondary | ICD-10-CM

## 2018-10-11 NOTE — Patient Instructions (Signed)
Need an EMG/NCS (see below)  Carpal Tunnel Syndrome Carpal tunnel syndrome is a condition that causes pain in your hand and arm. The carpal tunnel is a narrow area that is on the palm side of your wrist. Repeated wrist motion or certain diseases may cause swelling in the tunnel. This swelling can pinch the main nerve in the wrist (median nerve). Follow these instructions at home: If you have a splint:  Wear it as told by your doctor. Remove it only as told by your doctor.  Loosen the splint if your fingers: ? Become numb and tingle. ? Turn blue and cold.  Keep the splint clean and dry. General instructions  Take over-the-counter and prescription medicines only as told by your doctor.  Rest your wrist from any activity that may be causing your pain. If needed, talk to your employer about changes that can be made in your work, such as getting a wrist pad to use while typing.  If directed, apply ice to the painful area: ? Put ice in a plastic bag. ? Place a towel between your skin and the bag. ? Leave the ice on for 20 minutes, 2-3 times per day.  Keep all follow-up visits as told by your doctor. This is important.  Do any exercises as told by your doctor, physical therapist, or occupational therapist. Contact a doctor if:  You have new symptoms.  Medicine does not help your pain.  Your symptoms get worse. This information is not intended to replace advice given to you by your health care provider. Make sure you discuss any questions you have with your health care provider. Document Released: 10/15/2011 Document Revised: 04/02/2016 Document Reviewed: 03/13/2015 Elsevier Interactive Patient Education  2018 [employer] Inc.  Electromyoneurogram Electromyoneurogram is a test to check how well your muscles and nerves are working. This procedure includes the combined use of electromyogram (EMG) and nerve conduction study (NCS). EMG is used to look for muscular disorders. NCS, which is  also called electroneurogram, measures how well your nerves are controlling your muscles. The procedures are usually performed together to check if your muscles and nerves are healthy. If the reaction to testing is abnormal, this can indicate disease or injury, such as peripheral nerve damage. Tell a health care provider about:  Any allergies you have.  All medicines you are taking, including vitamins, herbs, eye drops, creams, and over-the-counter medicines.  Any problems you or family members have had with anesthetic medicines.  Any blood disorders you have.  Any surgeries you have had.  Any medical conditions you have.  Any pacemaker you have. What are the risks? Generally, this is a safe procedure. However, problems may occur, including:  Infection where the electrodes were inserted.  Bleeding.  What happens before the procedure?  Ask your health care provider about: ? Changing or stopping your regular medicines. This is especially important if you are taking diabetes medicines or blood thinners. ? Taking medicines such as aspirin and ibuprofen. These medicines can thin your blood. Do not take these medicines before your procedure if your health care provider instructs you not to.  Your health care provider may ask you to avoid: ? Caffeine, such as coffee and tea. ? Nicotine. This includes cigarettes and anything with tobacco.  Do not use lotions or creams on the same day that you will be having the procedure. What happens during the procedure? For EMG:  Your health care provider will ask you to stay in a position so that he  or she can access the muscle that will be studied. You may be standing, sitting down, or lying down.  You may be given a medicine that numbs the area (local anesthetic).  A very thin needle that has an electrode on it will be inserted into your muscle.  Another small electrode will be placed on your skin near the muscle.  Your health care  provider will ask you to continue to remain still.  The electrodes will send a signal that tells about the electrical activity of your muscles. You may see this on a monitor or hear it in the room.  After your muscles have been studied at rest, your health care provider will ask you to contract or flex your muscles. The electrodes will send a signal that tells about the electrical activity of your muscles.  Your health care provider will remove the electrodes and the electrode needles when the procedure is finished. The procedure may vary among health care providers and hospitals. For NCS:  An electrode that records your nerve activity (recording electrode) will be placed on your skin by the muscle that is being studied.  An electrode that is used as a reference (reference electrode) will be placed near the recording electrode.  A paste or gel will be applied to your skin between the recording electrode and the reference electrode.  Your nerve will be stimulated with a mild shock. Your health care provider will measure how much time it takes for your muscle to react.  Your health care provider will remove the electrodes and the gel when the procedure is finished. The procedure may vary among health care providers and hospitals. What happens after the procedure?  It is your responsibility to obtain your test results. Ask your health care provider or the department performing the test when and how you will get your results.  Your health care provider may: ? Give you medicines for any pain. ? Monitor the insertion sites to make sure that they stop bleeding. This information is not intended to replace advice given to you by your health care provider. Make sure you discuss any questions you have with your health care provider. Document Released: 02/26/2005 Document Revised: 04/02/2016 Document Reviewed: 12/17/2014 Elsevier Interactive Patient Education  2018 [employer].

## 2018-10-11 NOTE — Progress Notes (Signed)
[facility]    Provider:  Dr Lucia Gaskins Referring Provider: Richmond Campbell., PA-C Primary Care Physician:  Richmond Campbell., PA-C  CC:  Left hand numb  HPI:  Anthony Clay is a 84 y.o. male here as requested by Dr. Arlyce Dice for numbness.  Past medical history high blood pressure, anxiety, osteoarthritis, lumbago, spondylolisthesis of lumbar region. Left hand numbness. Ongoing "quite some time ago" within the last year. No inciting event, no trauma, nothing new, no physical work. Feels its the whole hand. He does not have neck pain or radicular symptoms. Difficulty with fine motor. Used to be worst in the morning now continous. Slowly progressive.  Reviewed notes, labs and imaging from outside physicians, which showed:   Reviewed notes from Mady Gemma.  Patient has decreased sensation in his fingers also wakes having finger numbness has been going on for "a while.  Patient gets no regular exercise.  No other concerns of cognitive impairment or other physical restrictions.  Review of Systems: Patient complains of symptoms per HPI as well as the following symptoms: numbness, back pain. Pertinent negatives and positives per HPI. All others negative.   Social History   Socioeconomic History  . Marital status: Married    Spouse name: Jenel Lucks  . Number of children: 3  . Years of education: Not on file  . Highest education level: Master's degree (e.g., MA, MS, MEng, MEd, MSW, MBA)  Occupational History  . Not on file  Social Needs  . Financial resource strain: Not on file  . Food insecurity:    Worry: Not on file    Inability: Not on file  . Transportation needs:    Medical: Not on file    Non-medical: Not on file  Tobacco Use  . Smoking status: Former Smoker    Packs/day: 0.50    Years: 10.00    Pack years: 5.00    Types: Cigarettes    Last attempt to quit: 05/01/1978    Years since quitting: 40.4  . Smokeless tobacco: Never Used  Substance and Sexual  Activity  . Alcohol use: Yes    Alcohol/week: 3.0 standard drinks    Types: 3 Glasses of wine per week    Comment: moderate consumption  . Drug use: No  . Sexual activity: Not on file  Lifestyle  . Physical activity:    Days per week: Not on file    Minutes per session: Not on file  . Stress: Not on file  Relationships  . Social connections:    Talks on phone: Not on file    Gets together: Not on file    Attends religious service: Not on file    Active member of club or organization: Not on file    Attends meetings of clubs or organizations: Not on file    Relationship status: Not on file  . Intimate partner violence:    Fear of current or ex partner: Not on file    Emotionally abused: Not on file    Physically abused: Not on file    Forced sexual activity: Not on file  Other Topics Concern  . Not on file  Social History Narrative   Lives at home with his wife   Right handed   Drinks at least 3 cups of caffeine daily    Family History  Problem Relation Age of Onset  . Colon cancer Father   . Neuropathy Neg Hx     Past Medical History:  Diagnosis Date  .  Allergy   . Anxiety    ?? d/t cortisone shot  . Arthritis   . Chronic low back pain   . GERD (gastroesophageal reflux disease)    occasional--takes  OTC  . Hypertension   . Osteoarthritis     Past Surgical History:  Procedure Laterality Date  . APPENDECTOMY    . BACK SURGERY  2015   L 3-5 fusion with cages and screws  . EYE SURGERY     bilateral cataracts  . HERNIA REPAIR     bilateral  . ROTATOR CUFF REPAIR Left 2012  . TONSILLECTOMY      Current Outpatient Medications  Medication Sig Dispense Refill  . amLODipine (NORVASC) 5 MG tablet Take 5 mg by mouth daily. For HTN    . bimatoprost (LUMIGAN) 0.01 % SOLN Place 1 drop into both eyes at bedtime.    . calcium carbonate (OS-CAL) 600 MG TABS tablet Take 600 mg by mouth daily with breakfast. For calcium supplement    . IRON PO Take 65 mg by mouth  every other day.     . losartan (COZAAR) 100 MG tablet Take 100 mg by mouth daily.    . Multiple Vitamin (MULTIVITAMIN WITH MINERALS) TABS tablet Take 1 tablet by mouth daily. For supplement    . timolol (TIMOPTIC) 0.5 % ophthalmic solution Place 1 drop into both eyes every morning.     No current facility-administered medications for this visit.     Allergies as of 10/11/2018  . (No Known Allergies)    Vitals: BP 130/80 (BP Location: Right Arm, Patient Position: Sitting)   Pulse 68   Ht 5\' 11"  (1.803 m)   Wt 198 lb (89.8 kg)   BMI 27.62 kg/m  Last Weight:  Wt Readings from Last 1 Encounters:  10/11/18 198 lb (89.8 kg)   Last Height:   Ht Readings from Last 1 Encounters:  10/11/18 5\' 11"  (1.803 m)   Physical exam: Exam: Gen: NAD, conversant, well nourised, obese, well groomed                     CV: RRR, no MRG. No Carotid Bruits. No peripheral edema, warm, nontender Eyes: Conjunctivae clear without exudates or hemorrhage  Neuro: Detailed Neurologic Exam  Speech:    Speech is normal; fluent and spontaneous with normal comprehension.  Cognition:    The patient is oriented to person, place, and time;     recent and remote memory intact;     language fluent;     normal attention, concentration,     fund of knowledge Cranial Nerves:    The pupils are equal, round, and reactive to light. The fundi are normal and spontaneous venous pulsations are present. Visual fields are full to finger confrontation. Extraocular movements are intact. Trigeminal sensation is intact and the muscles of mastication are normal. The face is symmetric. The palate elevates in the midline. Hearing intact. Voice is normal. Shoulder shrug is normal. The tongue has normal motion without fasciculations.   Motor Observation:    no involuntary movements noted. Flattening of the thenar eminence bilat. Tone:    Normal muscle tone.    Posture:    Posture is normal. normal erect    Strength:     Strength is V/V in the upper and lower limbs.      Sensation: intact to pin prick on the hands        Assessment/Plan:  83 year old with likely CTS will order  emg/ncs. PMHx drug use with methamphetamines and is a former smoker, alcohol use, HTN, anxiety.  Very unlikely stroke because slowly progressive, no neck or radicular symptoms.  Emg/ncs: bilateral upper extremities  Orders Placed This Encounter  Procedures  . NCV with EMG(electromyography)   A total of 25 minutes was spent face-to-face with this patient. Over half this time was spent on counseling patient on the  1. Bilateral carpal tunnel syndrome    diagnosis and different diagnostic and therapeutic options, counseling and coordination of care, risks ans benefits of management, compliance, or risk factor reduction and education.    Naomie DeanAntonia , MD  [facility] [address] [place], [place] [id number]  Phone [phone] Fax [phone]

## 2018-11-15 ENCOUNTER — Ambulatory Visit (INDEPENDENT_AMBULATORY_CARE_PROVIDER_SITE_OTHER): Payer: Medicare Other | Admitting: Neurology

## 2018-11-15 ENCOUNTER — Encounter: Payer: Self-pay | Admitting: Neurology

## 2018-11-15 DIAGNOSIS — G5603 Carpal tunnel syndrome, bilateral upper limbs: Secondary | ICD-10-CM

## 2018-11-15 DIAGNOSIS — G629 Polyneuropathy, unspecified: Secondary | ICD-10-CM

## 2018-11-15 DIAGNOSIS — G609 Hereditary and idiopathic neuropathy, unspecified: Secondary | ICD-10-CM

## 2018-11-15 HISTORY — DX: Polyneuropathy, unspecified: G62.9

## 2018-11-15 NOTE — Procedures (Signed)
     HISTORY:  Anthony Clay is an 85 year old gentleman with a several month history of numbness involving the left hand.  The patient denies any numbness of the feet or of the right hand, he denies any balance issues.  He does report some low back pain.  He is being evaluated for a possible neuropathy or a radiculopathy.  NERVE CONDUCTION STUDIES:  Nerve conduction studies were performed on both upper extremities.  The distal motor latencies for the median nerves were prolonged bilaterally with a low motor amplitude on the left median nerve, normal on the right.  The distal motor latencies for the ulnar nerves were borderline normal bilaterally with a low motor amplitude on the right, normal on the left.  Slowing was seen for the median and ulnar nerves bilaterally.  The sensory latencies for the radial nerves, median nerves, and ulnar nerves were prolonged bilaterally.  The F-wave latencies for the ulnar nerves were prolonged bilaterally.  Nerve conduction studies were performed on the left lower extremity.  No response was seen for the left peroneal nerve.  The distal motor latency for the left posterior tibial nerve was prolonged with a low motor amplitude.  Slowing was seen for this nerve.  The left sural sensory latency was borderline normal but the left peroneal sensory latency was unobtainable.  The F-wave latency for left posterior tibial nerve was prolonged.  EMG STUDIES:  EMG study was performed on the left upper extremity:  The first dorsal interosseous muscle reveals 2 to 4 K units with decreased recruitment. No fibrillations or positive waves were noted. The abductor pollicis brevis muscle reveals 2 to 4 K units with decreased recruitment. No fibrillations or positive waves were noted. The extensor indicis proprius muscle reveals 1 to 3 K units with full recruitment. No fibrillations or positive waves were noted. The pronator teres muscle reveals 2 to 3 K units with full  recruitment. No fibrillations or positive waves were noted. The biceps muscle reveals 1 to 2 K units with full recruitment. No fibrillations or positive waves were noted. The triceps muscle reveals 2 to 4 K units with full recruitment. No fibrillations or positive waves were noted. The anterior deltoid muscle reveals 2 to 3 K units with full recruitment. No fibrillations or positive waves were noted. The cervical paraspinal muscles were tested at 2 levels. No abnormalities of insertional activity were seen at either level tested. There was good relaxation.   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  There appears to be an overlying evidence of bilateral carpal tunnel syndrome, more significant on the left than the right.  EMG evaluation of the left upper extremity shows chronic stable distal signs of denervation consistent with the diagnosis of peripheral neuropathy.  No evidence of an overlying cervical radiculopathy was seen.  Marlan Palau MD 11/15/2018 3:07 PM  [facility] [address] [place], [place] [address]  Phone [phone] Fax [phone]

## 2018-11-15 NOTE — Progress Notes (Signed)
Please refer to EMG and nerve conduction procedure note.  

## 2018-11-16 NOTE — Progress Notes (Signed)
[facility]    Nerve / Sites Muscle Latency Ref. Amplitude Ref. Rel Amp Segments Distance Velocity Ref. Area    ms ms mV mV %  cm m/s m/s mVms  L Median - APB     Wrist APB 7.7 ?4.4 2.6 ?4.0 100 Wrist - APB 7   11.2     Upper arm APB 13.6  2.3  90.1 Upper arm - Wrist 27 45 ?49 10.7  R Median - APB     Wrist APB 5.9 ?4.4 4.0 ?4.0 100 Wrist - APB 7   15.3     Upper arm APB 12.1  3.7  92.6 Upper arm - Wrist 28 46 ?49 14.5  L Ulnar - ADM     Wrist ADM 3.3 ?3.3 6.0 ?6.0 100 Wrist - ADM 7   22.7     B.Elbow ADM 9.2  5.4  90.4 B.Elbow - Wrist 20 34 ?49 22.0     A.Elbow ADM 11.7  5.4  99.5 A.Elbow - B.Elbow 10 39 ?49 21.9         A.Elbow - Wrist      R Ulnar - ADM     Wrist ADM 3.3 ?3.3 4.7 ?6.0 100 Wrist - ADM 7   14.9     B.Elbow ADM 8.1  3.8  80.5 B.Elbow - Wrist 20 42 ?49 14.4     A.Elbow ADM 10.5  3.7  96.1 A.Elbow - B.Elbow 10 43 ?49 15.0         A.Elbow - Wrist      L Peroneal - EDB     Ankle EDB NR ?6.5 NR ?2.0 NR Ankle - EDB 9   NR     Fib head EDB NR  NR  NR Fib head - Ankle 35 NR ?44 NR  L Tibial - AH     Ankle AH 5.9 ?5.8 2.4 ?4.0 100 Ankle - AH 9   6.2     Pop fossa AH 17.4  1.5  62.1 Pop fossa - Ankle 40 35 ?41 5.3                  SNC    Nerve / Sites Rec. Site Peak Lat Ref.  Amp Ref. Segments Distance    ms ms V V  cm  L Radial - Anatomical snuff box (Forearm)     Forearm Wrist 3.2 ?2.9 15 ?15 Forearm - Wrist 10  R Radial - Anatomical snuff box (Forearm)     Forearm Wrist 3.5 ?2.9 16 ?15 Forearm - Wrist 10  L Sural - Ankle (Calf)     Calf Ankle 4.4 ?4.4 3 ?6 Calf - Ankle 14  L Superficial peroneal - Ankle     Lat leg Ankle NR ?4.4 NR ?6 Lat leg - Ankle 14  L Median - Orthodromic (Dig II, Mid palm)     Dig II Wrist 5.8 ?3.4 5 ?10 Dig II - Wrist 13  R Median - Orthodromic (Dig II, Mid palm)     Dig II Wrist 4.9 ?3.4 3 ?10 Dig II - Wrist 13  L Ulnar - Orthodromic, (Dig V, Mid palm)     Dig V Wrist 4.1 ?3.1 5 ?5 Dig V - Wrist 11  R Ulnar - Orthodromic, (Dig V, Mid palm)     Dig V Wrist 4.3 ?3.1 4 ?5 Dig V - Wrist 11  F  Wave    Nerve F Lat Ref.   ms ms  L Ulnar - ADM 35.1 ?32.0  R Ulnar - ADM 32.2 ?32.0  L Tibial - AH 64.9 ?56.0

## 2018-11-17 NOTE — Progress Notes (Signed)
Anthony Clay, patient's emg/ncs showed left >> right carpal tunnel syndrome. We can refer him to orthopaedics for evaluation of surgical or other treatment. If he agrees please refer him to Dr. Amanda Pea at [facility] ([facility] now ) for bilateral CTS. He can also try conservative measures like wrist braces, thanks

## 2018-11-18 ENCOUNTER — Telehealth: Payer: Self-pay | Admitting: Neurology

## 2018-11-18 DIAGNOSIS — G5603 Carpal tunnel syndrome, bilateral upper limbs: Secondary | ICD-10-CM

## 2018-11-18 NOTE — Telephone Encounter (Signed)
Called the patient and advised him of the NCV/EMG finding. Patient has been attempting to use the wrist braces and they have not helped. Pt would like referral sent to [facility]. Informed I would place the referral for him and they would be in contact to schedule. Pt verbalized understanding and was appreciative for the call.

## 2018-11-18 NOTE — Telephone Encounter (Signed)
-----   Message from Anson Fret, MD sent at 11/17/2018  6:24 PM EST -----   ----- Message ----- From: York Spaniel, MD Sent: 11/15/2018   3:12 PM EST To: Anson Fret, MD

## 2019-11-25 ENCOUNTER — Ambulatory Visit: Payer: Medicare Other | Attending: Internal Medicine

## 2019-11-25 DIAGNOSIS — Z23 Encounter for immunization: Secondary | ICD-10-CM | POA: Insufficient documentation

## 2019-11-25 NOTE — Progress Notes (Signed)
   Covid-19 Vaccination Clinic  Name:  NYREE YONKER    MRN: [id number] DOB: 03-17-1928  11/25/2019  Mr. Mccormac was observed post Covid-19 immunization for 15 minutes without incidence. He was provided with Vaccine Information Sheet and instruction to access the V-Safe system.   Mr. Mangione was instructed to call 911 with any severe reactions post vaccine: Marland Kitchen Difficulty breathing  . Swelling of your face and throat  . A fast heartbeat  . A bad rash all over your body  . Dizziness and weakness    Immunizations Administered    Name Date Dose VIS Date Route   Pfizer COVID-19 Vaccine 11/25/2019 12:14 PM 0.3 mL 10/20/2019 Intramuscular   Manufacturer: [employer], [employer]   Lot: [id number]   NDC: [address]-7

## 2019-12-14 ENCOUNTER — Ambulatory Visit: Payer: Medicare Other | Attending: Internal Medicine

## 2019-12-14 DIAGNOSIS — Z23 Encounter for immunization: Secondary | ICD-10-CM | POA: Insufficient documentation

## 2019-12-14 NOTE — Progress Notes (Signed)
   Covid-19 Vaccination Clinic  Name:  Anthony Clay    MRN: [id number] DOB: 1927-12-05  12/14/2019  [name] was observed post Covid-19 immunization for 15 minutes without incidence. He was provided with Vaccine Information Sheet and instruction to access the V-Safe system.   [name] was instructed to call 911 with any severe reactions post vaccine: Marland Kitchen Difficulty breathing  . Swelling of your face and throat  . A fast heartbeat  . A bad rash all over your body  . Dizziness and weakness    Immunizations Administered    Name Date Dose VIS Date Route   Pfizer COVID-19 Vaccine 12/14/2019  9:07 AM 0.3 mL 10/20/2019 Intramuscular   Manufacturer: [employer], [employer]   Lot: [id number]   NDC: [address]-0

## 2020-03-13 ENCOUNTER — Other Ambulatory Visit: Payer: Self-pay | Admitting: Family Medicine

## 2020-03-13 DIAGNOSIS — R2241 Localized swelling, mass and lump, right lower limb: Secondary | ICD-10-CM

## 2020-03-15 ENCOUNTER — Ambulatory Visit
Admission: RE | Admit: 2020-03-15 | Discharge: 2020-03-15 | Disposition: A | Discharge status: Home / Self Care | Payer: Medicare Other | Source: Ambulatory Visit | Attending: Family Medicine | Admitting: Family Medicine

## 2020-03-15 DIAGNOSIS — R2241 Localized swelling, mass and lump, right lower limb: Secondary | ICD-10-CM

## 2020-06-20 ENCOUNTER — Other Ambulatory Visit: Payer: Self-pay

## 2020-06-20 ENCOUNTER — Encounter: Payer: Self-pay | Admitting: Cardiology

## 2020-06-20 ENCOUNTER — Ambulatory Visit: Payer: Medicare Other | Admitting: Cardiology

## 2020-06-20 VITALS — BP 122/80 | HR 87 | Ht 71.0 in | Wt 196.0 lb

## 2020-06-20 DIAGNOSIS — R600 Localized edema: Secondary | ICD-10-CM

## 2020-06-20 DIAGNOSIS — N1832 Chronic kidney disease, stage 3b: Secondary | ICD-10-CM

## 2020-06-20 DIAGNOSIS — I351 Nonrheumatic aortic (valve) insufficiency: Secondary | ICD-10-CM

## 2020-06-20 DIAGNOSIS — E7800 Pure hypercholesterolemia, unspecified: Secondary | ICD-10-CM

## 2020-06-20 DIAGNOSIS — I493 Ventricular premature depolarization: Secondary | ICD-10-CM

## 2020-06-20 DIAGNOSIS — R0609 Other forms of dyspnea: Secondary | ICD-10-CM

## 2020-06-20 DIAGNOSIS — I10 Essential (primary) hypertension: Secondary | ICD-10-CM

## 2020-06-20 MED ORDER — TRIAMTERENE-HCTZ 37.5-25 MG PO TABS: 1.0000 | ORAL_TABLET | Freq: Every day | ORAL | 2 refills | Status: DC

## 2020-06-20 MED ORDER — ROSUVASTATIN CALCIUM 5 MG PO TABS: 5.0000 mg | ORAL_TABLET | Freq: Every day | ORAL | 3 refills | Status: DC

## 2020-06-20 NOTE — Progress Notes (Signed)
Primary Physician/Referring:  Aletha Halim., PA-C  Patient ID: Anthony Clay, male    DOB: 09-05-1928, 86 y.o.   MRN: [id number]  Chief Complaint  Patient presents with  . Premature beats  . New Patient (Initial Visit)    Per Irene Pap, DO   HPI:    ZURIEL ROSKOS  is a 86 y.o. Caucasian male who presents for a follow-up for irregular heartbeat noted when he went to [employer] to give blood.  He is now referred back to me for evaluation of PVCs noted on the EKG.  I had last seen him in November 2019.  He has history of hypertension and chronic back pain, chronic dyspnea on exertion. He was evaluated by [place] and underwent echocardiogram on 03/09/2018 that showed mild LVH, mild/moderate aortic regurgitation, and mild aortic root dilation after 3.9 cm. He also underwent Lexiscan nuclear stress test on 02/21/2018 that was considered low risk.  No changes in symptomatology since last seen by [place]. He denies chest pain, palpitations, tightness, orthopnea, or syncope.  His activity significantly reduced due to degenerative joint disease and especially back pain.  Past Medical History:  Diagnosis Date  . Allergy   . Anxiety    ?? d/t cortisone shot  . Arthritis   . Chronic low back pain   . GERD (gastroesophageal reflux disease)    occasional--takes  OTC  . Hypertension   . Osteoarthritis   . Peripheral neuropathy 11/15/2018   Past Surgical History:  Procedure Laterality Date  . APPENDECTOMY    . BACK SURGERY  2015   L 3-5 fusion with cages and screws  . EYE SURGERY     bilateral cataracts  . HERNIA REPAIR     bilateral  . ROTATOR CUFF REPAIR Left 2012  . TONSILLECTOMY     Family History  Problem Relation Age of Onset  . Colon cancer Father   . Cancer Mother   . Neuropathy Neg Hx     Social History   Tobacco Use  . Smoking status: Former Smoker    Packs/day: 0.50    Years: 10.00    Pack years: 5.00    Types: Cigarettes    Quit date: 05/01/1978    Years since  quitting: 42.1  . Smokeless tobacco: Never Used  Substance Use Topics  . Alcohol use: Yes    Alcohol/week: 3.0 standard drinks    Types: 3 Glasses of wine per week    Comment: moderate consumption   Marital Status: Married  ROS  Review of Systems  Cardiovascular: Positive for dyspnea on exertion. Negative for chest pain and leg swelling.  Musculoskeletal: Positive for back pain and joint pain.  Gastrointestinal: Negative for melena.   Objective  Blood pressure 122/80, pulse 87, height 5' 11"  (1.803 m), weight 196 lb (88.9 kg), SpO2 96 %.  Vitals with BMI 06/20/2020 10/11/2018 10/08/2017  Height 5' 11"  5' 11"  5' 11"   Weight 196 lbs 198 lbs 201 lbs 6 oz  BMI 27.35 67.59 16.3  Systolic 846 659 935  Diastolic 80 80 84  Pulse 87 68 77     Physical Exam Cardiovascular:     Rate and Rhythm: Normal rate and regular rhythm.     Pulses: Intact distal pulses.          Carotid pulses are 2+ on the right side and 2+ on the left side.    Heart sounds: Normal heart sounds. No murmur heard.  No gallop.  Comments: 1-2+ right ankle edema, pitting and no edema left leg.  No JVD.   Pulmonary:     Effort: Pulmonary effort is normal.     Breath sounds: Normal breath sounds.  Abdominal:     General: Bowel sounds are normal.     Palpations: Abdomen is soft.    Laboratory examination:   No results for input(s): NA, K, CL, CO2, GLUCOSE, BUN, CREATININE, CALCIUM, GFRNONAA, GFRAA in the last 8760 hours. CrCl cannot be calculated (Patient's most recent lab result is older than the maximum 21 days allowed.).  CMP Latest Ref Rng & Units 10/08/2017 01/10/2014 01/10/2014  Glucose 65 - 99 mg/dL 90 152(H) 141(H)  BUN 8 - 27 mg/dL 16 - -  Creatinine 0.76 - 1.27 mg/dL 1.32(H) - -  Sodium 134 - 144 mmol/L 141 140 140  Potassium 3.5 - 5.2 mmol/L 4.6 4.6 4.4  Chloride 96 - 106 mmol/L 101 - -  CO2 20 - 29 mmol/L 25 - -  Calcium 8.6 - 10.2 mg/dL 9.5 - -   CBC Latest Ref Rng & Units 01/10/2014 01/10/2014  01/01/2014  WBC 4.0 - 10.5 K/uL - - 6.5  Hemoglobin 13.0 - 17.0 g/dL 10.5(L) 9.5(L) 12.5(L)  Hematocrit 39 - 52 % 31.0(L) 28.0(L) 37.4(L)  Platelets 150 - 400 K/uL - - 229    Lipid Panel No results for input(s): CHOL, TRIG, LDLCALC, VLDL, HDL, CHOLHDL, LDLDIRECT in the last 8760 hours.  HEMOGLOBIN A1C No results found for: HGBA1C, MPG TSH No results for input(s): TSH in the last 8760 hours.  External labs:   Lab 03/13/2020:  Sodium 140, potassium 4.3, BUN 24, creatinine 1.29, EGFR 49 mL.  TSH normal at 3.760.  Vitamin B12 normal.  Hb 12.8/HCT 36.5, mild microcytic indicis.  Platelets 222.  Labs 09/14/2019:  Total cholesterol 200, triglycerides 167, HDL 50, direct LDL 129.  Non-HDL cholesterol 150.  Medications and allergies  No Known Allergies   Outpatient Medications Prior to Visit  Medication Sig Dispense Refill  . B Complex Vitamins (VITAMIN B COMPLEX) TABS Take 1 tablet by mouth daily.    . bimatoprost (LUMIGAN) 0.01 % SOLN Place 1 drop into both eyes at bedtime.    . calcium carbonate (OS-CAL) 600 MG TABS tablet Take 600 mg by mouth daily with breakfast. For calcium supplement    . ferrous sulfate 325 (65 FE) MG EC tablet Take 1 tablet by mouth every other day.    . furosemide (LASIX) 20 MG tablet Take 1 tablet (20 mg total) by mouth daily as needed for edema. 30 tablet   . Multiple Vitamin (MULTIVITAMIN WITH MINERALS) TABS tablet Take 1 tablet by mouth daily. For supplement    . timolol (TIMOPTIC) 0.5 % ophthalmic solution Place 1 drop into both eyes every morning.    . valsartan (DIOVAN) 160 MG tablet Take 160 mg by mouth daily.    . furosemide (LASIX) 20 MG tablet Take 20 mg by mouth every morning.    Marland Kitchen amLODipine (NORVASC) 5 MG tablet Take 5 mg by mouth daily. For HTN    . IRON PO Take 65 mg by mouth every other day.     . losartan (COZAAR) 100 MG tablet Take 100 mg by mouth daily.     No facility-administered medications prior to visit.    Radiology:   No  results found.  Cardiac Studies:   Echocardiogram 09/13/2018: Left ventricle cavity is normal in size. Normal global wall motion. Calculated EF 57%. Trileaflet aortic valve  with mild leaflet calcification and restriction. Trace aortic stenosis. Moderate (Grade II) aortic regurgitation. Mild tricuspid regurgitation. Estimated pulmonary artery systolic pressure 28 mmHg. Mild dilation of ascending aorta, measuring 4.1 cm. no significant change No significant change from previous study dated 03/09/2018  Lexiscan myoview stress test 02/21/2018: 1. Lexiscan stress test performed. Exercise capacity not assessed. Stress symptoms included dyspnea. Peak BP 160/86 mmHg. 2. The overall quality of the study is fair. Review of the raw data in a rotational cine format reveals diaphragmatic attenuation. Left ventricular cavity is noted to be normal on the rest and stress studies. Gated SPECT images reveal normal myocardial thickening and wall motion. The left ventricular ejection fraction was calculated to be 43%, although visually appears normal. Small size mild intensity perfusion defect in apical inferolateral region seen on rest images, that improves on stress images. These defects are likely related to diaphragmatic attenuation. 3. Low to intermediate risk study due to decreased LVEF.  EKG  EKG 06/20/2020: Normal sinus rhythm at the rate of 76 bpm, left axis deviation, left anterior fascicular block.  Right bundle branch block.  Poor R wave progression, cannot exclude anterolateral infarct old.  PVCs (2).      EKG 06/13/2020: Normal sinus rhythm at rate of 79 bpm, left axis deviation, left anterior fascicular block.  Poor R wave progression, cannot exclude anterolateral infarct old. Right bundle branch block.  Low-voltage complexes, pulmonary disease pattern. Single PVC.  EKG 09/22/2018 11 sinus rhythm with first-degree AV block at the rate of 68 bpm, left axis deviation, left anterior fascicular block.  Incomplete right bundle branch block. Cannot exclude true posterior infarct.  Assessment     ICD-10-CM   1. Ventricular premature depolarization  I49.3 EKG 12-Lead  2. Primary hypertension  T01 Basic metabolic panel    triamterene-hydrochlorothiazide (MAXZIDE-25) 37.5-25 MG tablet    DISCONTINUED: triamterene-hydrochlorothiazide (MAXZIDE-25) 37.5-25 MG tablet  3. Moderate aortic regurgitation  I35.1 PCV ECHOCARDIOGRAM COMPLETE  4. Dyspnea on exertion  R06.00   5. Bilateral leg edema  R60.0 furosemide (LASIX) 20 MG tablet  6. Pure hypercholesterolemia  E78.00 rosuvastatin (CRESTOR) 5 MG tablet     Medications Discontinued During This Encounter  Medication Reason  . amLODipine (NORVASC) 5 MG tablet Change in therapy  . IRON PO Patient Preference  . losartan (COZAAR) 100 MG tablet Change in therapy  . furosemide (LASIX) 20 MG tablet Reorder  . triamterene-hydrochlorothiazide (MAXZIDE-25) 37.5-25 MG tablet     Meds ordered this encounter  Medications  . DISCONTD: triamterene-hydrochlorothiazide (MAXZIDE-25) 37.5-25 MG tablet    Sig: Take 1 tablet by mouth daily.    Dispense:  30 tablet    Refill:  2  . triamterene-hydrochlorothiazide (MAXZIDE-25) 37.5-25 MG tablet    Sig: Take 1 tablet by mouth daily.    Dispense:  30 tablet    Refill:  2  . rosuvastatin (CRESTOR) 5 MG tablet    Sig: Take 1 tablet (5 mg total) by mouth daily.    Dispense:  90 tablet    Refill:  3   Recommendations:   JOHNSON ARIZOLA is a 86 y.o. Caucasian male who presents for a follow-up for irregular heartbeat noted when he went to [employer] to give blood.  He is now referred back to me for evaluation of PVCs noted on the EKG.  I had last seen him in November 2019.  He has history of hypertension and chronic back pain, chronic dyspnea on exertion. He was evaluated by [place] and underwent echocardiogram  on 03/09/2018 that showed mild LVH, mild/moderate aortic regurgitation, and mild aortic root dilation after 3.9 cm.  He also underwent Lexiscan nuclear stress test on 02/21/2018 that was considered low risk.  He was referred back to me for evaluation of PVC noted on the EKG and try to donate blood but was told to have irregular heartbeat.  Again the EKG demonstrates PVCs.  He is essentially asymptomatic except for chronic dyspnea.  His physical examination is not changed since 2 years ago.  I cannot hear aortic regurgitation murmur, he has mild to at most moderate aortic regurgitation.  However I will repeat another echocardiogram to follow-up on valvular heart disease.  With regard to single PVC and occasional PVCs, he is asymptomatic except for occasional palpitations and in view of prior evaluation with a low risk stress test in 2019, would not recommend any further evaluation.  He is presently taking furosemide for leg edema and amlodipine was discontinued by his PCP.  Agree with the changes made, would probably make furosemide as needed use as his leg edema is essentially completely resolved and suspect will remain stable as he is off of amlodipine.  However blood pressure is slightly elevated, will add Maxide 37.5/25 mg 1 p.o. every morning.  I would like to obtain a BMP in 2 weeks.  He is also on valsartan, hence we will follow up on potassium levels closely.  I would like to see him back in 4 to 6 weeks for follow-up of hypertension and will also discuss the echocardiogram at that time.  Although 86 years of age, continues to remain fairly active at his own level.    Adrian Prows, MD, [facility] 06/20/2020, 5:59 PM Office: [phone]

## 2020-07-06 LAB — BASIC METABOLIC PANEL
BUN/Creatinine Ratio: 19 (ref 10–24)
BUN: 31 mg/dL — ABNORMAL HIGH (ref 8–27)
CO2: 23 mmol/L (ref 20–29)
Calcium: 9.7 mg/dL (ref 8.6–10.2)
Chloride: 99 mmol/L (ref 96–106)
Creatinine, Ser: 1.59 mg/dL — ABNORMAL HIGH (ref 0.76–1.27)
GFR calc Af Amer: 44 mL/min/{1.73_m2} — ABNORMAL LOW (ref >59)
GFR calc non Af Amer: 38 mL/min/{1.73_m2} — ABNORMAL LOW (ref >59)
Glucose: 115 mg/dL — ABNORMAL HIGH (ref 65–99)
Potassium: 4.7 mmol/L (ref 3.5–5.2)
Sodium: 137 mmol/L (ref 134–144)

## 2020-07-07 NOTE — Addendum Note (Signed)
Addended by: Delrae Rend on: 07/07/2020 03:24 PM   Modules accepted: Orders

## 2020-07-07 NOTE — Progress Notes (Signed)
Mild worsening renal function with addition of Maxzide. K levels good. Recheck in 1 month for stability and discuss on OV

## 2020-07-08 ENCOUNTER — Ambulatory Visit: Payer: Medicare Other

## 2020-07-08 ENCOUNTER — Other Ambulatory Visit: Payer: Self-pay

## 2020-07-08 DIAGNOSIS — I351 Nonrheumatic aortic (valve) insufficiency: Secondary | ICD-10-CM

## 2020-08-02 ENCOUNTER — Ambulatory Visit: Payer: Medicare Other | Admitting: Cardiology

## 2020-08-02 ENCOUNTER — Other Ambulatory Visit: Payer: Self-pay

## 2020-08-02 ENCOUNTER — Encounter: Payer: Self-pay | Admitting: Cardiology

## 2020-08-02 VITALS — BP 117/70 | HR 67 | Resp 15 | Ht 71.0 in | Wt 204.0 lb

## 2020-08-02 DIAGNOSIS — I493 Ventricular premature depolarization: Secondary | ICD-10-CM

## 2020-08-02 DIAGNOSIS — I351 Nonrheumatic aortic (valve) insufficiency: Secondary | ICD-10-CM

## 2020-08-02 DIAGNOSIS — I10 Essential (primary) hypertension: Secondary | ICD-10-CM

## 2020-08-02 MED ORDER — TRIAMTERENE-HCTZ 37.5-25 MG PO TABS: 1.0000 | ORAL_TABLET | Freq: Every day | ORAL | 3 refills | Status: DC

## 2020-08-02 NOTE — Progress Notes (Signed)
Primary Physician/Referring:  Aletha Halim., PA-C  Patient ID: Anthony Clay, male    DOB: 07-27-1928, 86 y.o.   MRN: [id number]  Chief Complaint  Patient presents with  . Follow-up    6 week  . Hypertension  . Aortic regurgitation  . Leg Swelling   HPI:    Anthony Clay  is a 86 y.o. Caucasian male who presents for a follow-up for irregular heartbeat noted when he went to [employer] to give blood.   He has history of asymptomatic PVCs with normal LVEF, moderate aortic regurgitation, hypertension and chronic back pain, chronic dyspnea on exertion. \  Since discontinuing amlodipine, his leg edema has completely resolved.  Except for mild chronic dyspnea he remains asymptomatic without palpitations, dizziness or syncope.  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  Past Medical History:  Diagnosis Date  . Allergy   . Anxiety    ?? d/t cortisone shot  . Arthritis   . Chronic low back pain   . GERD (gastroesophageal reflux disease)    occasional--takes  OTC  . Hypertension   . Osteoarthritis   . Peripheral neuropathy 11/15/2018   Past Surgical History:  Procedure Laterality Date  . APPENDECTOMY    . BACK SURGERY  2015   L 3-5 fusion with cages and screws  . EYE SURGERY     bilateral cataracts  . HERNIA REPAIR     bilateral  . ROTATOR CUFF REPAIR Left 2012  . TONSILLECTOMY     Family History  Problem Relation Age of Onset  . Colon cancer Father   . Cancer Mother   . Neuropathy Neg Hx     Social History   Tobacco Use  . Smoking status: Former Smoker    Packs/day: 0.50    Years: 10.00    Pack years: 5.00    Types: Cigarettes    Quit date: 05/01/1978    Years since quitting: 42.2  . Smokeless tobacco: Never Used  Substance Use Topics  . Alcohol use: Yes    Alcohol/week: 3.0 standard drinks    Types: 3 Glasses of wine per week    Comment: Occa   Marital Status: Married  ROS  Review of  Systems  Cardiovascular: Positive for dyspnea on exertion. Negative for chest pain and leg swelling.  Musculoskeletal: Positive for back pain and joint pain.  Gastrointestinal: Negative for melena.   Objective  Blood pressure 117/70, pulse 67, resp. rate 15, height 5' 11"  (1.803 m), weight 204 lb (92.5 kg), SpO2 96 %.  Vitals with BMI 08/02/2020 06/20/2020 10/11/2018  Height 5' 11"  5' 11"  5' 11"   Weight 204 lbs 196 lbs 198 lbs  BMI 28.46 48.27 07.86  Systolic 754 492 010  Diastolic 70 80 80  Pulse 67 87 68     Physical Exam Cardiovascular:     Rate and Rhythm: Normal rate and regular rhythm.     Pulses: Intact distal pulses.          Carotid pulses are 2+ on the right side and 2+ on the left side.    Heart sounds: Heart sounds are distant. No murmur heard.  No gallop.      Comments: No pedal edema. No JVD.   Pulmonary:     Effort: Pulmonary effort is normal.     Breath sounds: Normal breath sounds.  Abdominal:     General: Bowel sounds are normal.  Palpations: Abdomen is soft.    Laboratory examination:   Recent Labs    07/05/20 0842 08/02/20 1101  NA 137 138  K 4.7 4.7  CL 99 101  CO2 23 24  GLUCOSE 115* 71  BUN 31* 29*  CREATININE 1.59* 1.44*  CALCIUM 9.7 9.6  GFRNONAA 38* 43*  GFRAA 44* 49*   estimated creatinine clearance is 40.4 mL/min (A) (by C-G formula based on SCr of 1.44 mg/dL (H)).  CMP Latest Ref Rng & Units 08/02/2020 07/05/2020 10/08/2017  Glucose 65 - 99 mg/dL 71 115(H) 90  BUN 8 - 27 mg/dL 29(H) 31(H) 16  Creatinine 0.76 - 1.27 mg/dL 1.44(H) 1.59(H) 1.32(H)  Sodium 134 - 144 mmol/L 138 137 141  Potassium 3.5 - 5.2 mmol/L 4.7 4.7 4.6  Chloride 96 - 106 mmol/L 101 99 101  CO2 20 - 29 mmol/L 24 23 25   Calcium 8.6 - 10.2 mg/dL 9.6 9.7 9.5   CBC Latest Ref Rng & Units 01/10/2014 01/10/2014 01/01/2014  WBC 4.0 - 10.5 K/uL - - 6.5  Hemoglobin 13.0 - 17.0 g/dL 10.5(L) 9.5(L) 12.5(L)  Hematocrit 39 - 52 % 31.0(L) 28.0(L) 37.4(L)  Platelets 150 - 400 K/uL  - - 229    Lipid Panel No results for input(s): CHOL, TRIG, LDLCALC, VLDL, HDL, CHOLHDL, LDLDIRECT in the last 8760 hours.  HEMOGLOBIN A1C No results found for: HGBA1C, MPG TSH No results for input(s): TSH in the last 8760 hours.  External labs:   Lab 03/13/2020:  Sodium 140, potassium 4.3, BUN 24, creatinine 1.29, EGFR 49 mL.  TSH normal at 3.760.  Vitamin B12 normal.  Hb 12.8/HCT 36.5, mild microcytic indicis.  Platelets 222.  Labs 09/14/2019:  Total cholesterol 200, triglycerides 167, HDL 50, direct LDL 129.  Non-HDL cholesterol 150.  Medications and allergies  No Known Allergies   Outpatient Medications Prior to Visit  Medication Sig Dispense Refill  . B Complex Vitamins (VITAMIN B COMPLEX) TABS Take 1 tablet by mouth daily.    . bimatoprost (LUMIGAN) 0.01 % SOLN Place 1 drop into both eyes at bedtime.    . calcium carbonate (OS-CAL) 600 MG TABS tablet Take 600 mg by mouth daily with breakfast. For calcium supplement    . ferrous sulfate 325 (65 FE) MG EC tablet Take 1 tablet by mouth every other day.    . Multiple Vitamin (MULTIVITAMIN WITH MINERALS) TABS tablet Take 1 tablet by mouth daily. For supplement    . rosuvastatin (CRESTOR) 5 MG tablet Take 1 tablet (5 mg total) by mouth daily. 90 tablet 3  . timolol (TIMOPTIC) 0.5 % ophthalmic solution Place 1 drop into both eyes every morning.    . valsartan (DIOVAN) 160 MG tablet Take 160 mg by mouth daily.    Marland Kitchen triamterene-hydrochlorothiazide (MAXZIDE-25) 37.5-25 MG tablet Take 1 tablet by mouth daily. 30 tablet 2  . furosemide (LASIX) 20 MG tablet Take 1 tablet (20 mg total) by mouth daily as needed for edema. (Patient not taking: Reported on 08/02/2020) 30 tablet    No facility-administered medications prior to visit.    Radiology:   No results found.  Cardiac Studies:   Lexiscan myoview stress test 02/21/2018: 1. Lexiscan stress test performed. Exercise capacity not assessed. Stress symptoms included dyspnea.  Peak BP 160/86 mmHg. 2. The overall quality of the study is fair. Review of the raw data in a rotational cine format reveals diaphragmatic attenuation. Left ventricular cavity is noted to be normal on the rest and stress studies. Gated  SPECT images reveal normal myocardial thickening and wall motion. The left ventricular ejection fraction was calculated to be 43%, although visually appears normal. Small size mild intensity perfusion defect in apical inferolateral region seen on rest images, that improves on stress images. These defects are likely related to diaphragmatic attenuation. 3. Low to intermediate risk study due to decreased LVEF.  Echocardiogram 07/08/2020: Left ventricle cavity is normal in size. Moderate concentric hypertrophy of the left ventricle. Normal global wall motion. Normal LV systolic function with visual EF 50-55%. Doppler evidence of grade I (impaired) diastolic dysfunction, normal LAP.  Left atrial cavity is mildly dilated. Trileaflet aortic valve with mild calcification. Moderate (Grade II) aortic regurgitation. Moderate (Grade II) mitral regurgitation. Mild tricuspid regurgitation.  No evidence of pulmonary hypertension. Compared to previous study on 09/14/2018, dilated aortic root not appreciated on this study.  EKG  EKG 06/20/2020: Normal sinus rhythm at the rate of 76 bpm, left axis deviation, left anterior fascicular block.  Right bundle branch block.  Poor R wave progression, cannot exclude anterolateral infarct old.  PVCs (2).      EKG 06/13/2020: Normal sinus rhythm at rate of 79 bpm, left axis deviation, left anterior fascicular block.  Poor R wave progression, cannot exclude anterolateral infarct old. Right bundle branch block.  Low-voltage complexes, pulmonary disease pattern. Single PVC.  EKG 09/22/2018 11 sinus rhythm with first-degree AV block at the rate of 68 bpm, left axis deviation, left anterior fascicular block. Incomplete right bundle branch block.  Cannot exclude true posterior infarct.  Assessment     ICD-10-CM   1. Ventricular premature depolarization  I49.3   2. Primary hypertension  E95 Basic metabolic panel    triamterene-hydrochlorothiazide (MAXZIDE-25) 37.5-25 MG tablet  3. Moderate aortic regurgitation  I35.1      Medications Discontinued During This Encounter  Medication Reason  . furosemide (LASIX) 20 MG tablet Change in therapy  . triamterene-hydrochlorothiazide (MAXZIDE-25) 37.5-25 MG tablet Reorder    Meds ordered this encounter  Medications  . triamterene-hydrochlorothiazide (MAXZIDE-25) 37.5-25 MG tablet    Sig: Take 1 tablet by mouth daily.    Dispense:  90 tablet    Refill:  3   Recommendations:   Anthony Clay is a 86 y.o. Caucasian male who presents for a follow-up for irregular heartbeat noted when he went to [employer] to give blood as he has PVCs by history but asymptomatic.  On his last office visit I discontinued furosemide and also amlodipine and started him on Maxide, he has had complete resolution of leg edema.  I reviewed his labs, renal function is slightly deteriorated however I would like to repeat his BMP today.  I also advised him that he could try one half dose of Maxide 37.5/25 mg and as long as the blood pressure is <130/80 and there is no leg edema, he could continue lowest dose possible.  With regard to moderate aortic regurgitation, no change in the echocardiogram.  Aortic root dilatation previously noted is no longer present however it will not matter as he is presently 86 years of age and the aortic root dilatation is only mild.  He remains stable from cardiac standpoint, I will see him back in 1 year or sooner if there is any problem.  Addendum: Stable renal function and will continue present meds.    Adrian Prows, MD, [facility] 08/03/2020, 9:30 AM Office: [phone]

## 2020-08-03 LAB — BASIC METABOLIC PANEL
BUN/Creatinine Ratio: 20 (ref 10–24)
BUN: 29 mg/dL — ABNORMAL HIGH (ref 8–27)
CO2: 24 mmol/L (ref 20–29)
Calcium: 9.6 mg/dL (ref 8.6–10.2)
Chloride: 101 mmol/L (ref 96–106)
Creatinine, Ser: 1.44 mg/dL — ABNORMAL HIGH (ref 0.76–1.27)
GFR calc Af Amer: 49 mL/min/{1.73_m2} — ABNORMAL LOW (ref >59)
GFR calc non Af Amer: 43 mL/min/{1.73_m2} — ABNORMAL LOW (ref >59)
Glucose: 71 mg/dL (ref 65–99)
Potassium: 4.7 mmol/L (ref 3.5–5.2)
Sodium: 138 mmol/L (ref 134–144)

## 2020-08-07 NOTE — Progress Notes (Signed)
Called and spoke with patient regarding his lab results.

## 2021-05-29 ENCOUNTER — Other Ambulatory Visit: Payer: Self-pay

## 2021-05-29 DIAGNOSIS — E7800 Pure hypercholesterolemia, unspecified: Secondary | ICD-10-CM

## 2021-05-29 MED ORDER — ROSUVASTATIN CALCIUM 5 MG PO TABS: 5.0000 mg | ORAL_TABLET | Freq: Every day | ORAL | 0 refills | Status: DC

## 2021-08-04 ENCOUNTER — Other Ambulatory Visit: Payer: Self-pay

## 2021-08-04 ENCOUNTER — Ambulatory Visit: Payer: Medicare Other | Admitting: Cardiology

## 2021-08-04 ENCOUNTER — Encounter: Payer: Self-pay | Admitting: Cardiology

## 2021-08-04 VITALS — BP 112/62 | HR 100 | Temp 98.6°F | Resp 17 | Ht 71.0 in | Wt 210.4 lb

## 2021-08-04 DIAGNOSIS — I10 Essential (primary) hypertension: Secondary | ICD-10-CM

## 2021-08-04 DIAGNOSIS — I351 Nonrheumatic aortic (valve) insufficiency: Secondary | ICD-10-CM

## 2021-08-04 DIAGNOSIS — N1831 Chronic kidney disease, stage 3a: Secondary | ICD-10-CM

## 2021-08-04 DIAGNOSIS — I340 Nonrheumatic mitral (valve) insufficiency: Secondary | ICD-10-CM

## 2021-08-04 NOTE — Progress Notes (Signed)
Primary Physician/Referring:  Aletha Halim., PA-C  Patient ID: Anthony Clay, male    DOB: 07-13-1928, 87 y.o.   MRN: [id number]  Chief Complaint  Patient presents with   Hypertension   aortic regurgitations   Follow-up    1 year   HPI:    Anthony Clay  is a 87 y.o. Caucasian male with hypertension, hyperlipidemia, moderate mitral and aortic regurgitation presents here for annual visit.  He is asymptomatic, his activity is limited due to back pain. His activity is also limited due to severe degenerative joint disease especially his bilateral knee arthritis.  Past Medical History:  Diagnosis Date   Allergy    Anxiety    ?? d/t cortisone shot   Arthritis    Chronic low back pain    GERD (gastroesophageal reflux disease)    occasional--takes  OTC   Hypertension    Osteoarthritis    Peripheral neuropathy 11/15/2018   Past Surgical History:  Procedure Laterality Date   APPENDECTOMY     BACK SURGERY  2015   L 3-5 fusion with cages and screws   EYE SURGERY     bilateral cataracts   HERNIA REPAIR     bilateral   ROTATOR CUFF REPAIR Left 2012   TONSILLECTOMY     Family History  Problem Relation Age of Onset   Colon cancer Father    Cancer Mother    Neuropathy Neg Hx     Social History   Tobacco Use   Smoking status: Former    Packs/day: 0.50    Years: 10.00    Pack years: 5.00    Types: Cigarettes    Quit date: 05/01/1978    Years since quitting: 43.2   Smokeless tobacco: Never  Substance Use Topics   Alcohol use: Yes    Alcohol/week: 3.0 standard drinks    Types: 3 Glasses of wine per week    Comment: Occa   Marital Status: Married  ROS  Review of Systems  Cardiovascular:  Negative for chest pain, dyspnea on exertion and leg swelling.  Musculoskeletal:  Positive for back pain and joint pain.  Gastrointestinal:  Negative for melena.  Objective  Blood pressure 112/62, pulse 100, temperature 98.6 F (37 C), temperature source Temporal, resp.  rate 17, height 5' 11" (1.803 m), weight 210 lb 6.4 oz (95.4 kg), SpO2 98 %.  Vitals with BMI 08/04/2021 08/02/2020 06/20/2020  Height 5' 11" 5' 11" 5' 11"  Weight 210 lbs 6 oz 204 lbs 196 lbs  BMI 29.36 58.85 02.77  Systolic 412 878 676  Diastolic 62 70 80  Pulse 720 67 87     Physical Exam Neck:     Vascular: No carotid bruit or JVD.  Cardiovascular:     Rate and Rhythm: Normal rate and regular rhythm.     Pulses: Intact distal pulses.     Heart sounds: Heart sounds are distant. Murmur heard.  Early systolic murmur is present with a grade of 2/6 at the upper right sternal border.  Blowing early diastolic murmur is present with a grade of 2/4 at the upper right sternal border.    No gallop.  Pulmonary:     Effort: Pulmonary effort is normal.     Breath sounds: Normal breath sounds.  Abdominal:     General: Bowel sounds are normal.     Palpations: Abdomen is soft.  Musculoskeletal:        General: No swelling.   Laboratory examination:  External labs:   Labs 07/24/2020:  Hb 13.5/HCT 39.8, platelets 230, mild microcytic indicis.  Sodium 140, potassium 4.3, BUN 24, creatinine 1.29, EGFR 49 mL.  TSH normal at 3.70.  Lab 03/13/2020:  Sodium 140, potassium 4.3, BUN 24, creatinine 1.29, EGFR 49 mL.  TSH normal at 3.760.  Vitamin B12 normal.  Hb 12.8/HCT 36.5, mild microcytic indicis.  Platelets 222.  Labs 09/14/2019:  Total cholesterol 200, triglycerides 167, HDL 50, direct LDL 129.  Non-HDL cholesterol 150.  Medications and allergies  No Known Allergies   Outpatient Medications Prior to Visit  Medication Sig Dispense Refill   B Complex Vitamins (VITAMIN B COMPLEX) TABS Take 1 tablet by mouth daily.     bimatoprost (LUMIGAN) 0.01 % SOLN Place 1 drop into both eyes at bedtime.     calcium carbonate (OS-CAL) 600 MG TABS tablet Take 600 mg by mouth daily with breakfast. For calcium supplement     ferrous sulfate 325 (65 FE) MG EC tablet Take 1 tablet by mouth every  other day.     ibuprofen (ADVIL) 800 MG tablet Take 1 tablet by mouth as needed.     Multiple Vitamin (MULTIVITAMIN WITH MINERALS) TABS tablet Take 1 tablet by mouth daily. For supplement     rosuvastatin (CRESTOR) 5 MG tablet Take 1 tablet (5 mg total) by mouth daily. 90 tablet 0   timolol (TIMOPTIC) 0.5 % ophthalmic solution Place 1 drop into both eyes every morning.     triamterene-hydrochlorothiazide (MAXZIDE-25) 37.5-25 MG tablet Take 1 tablet by mouth daily. 90 tablet 3   valsartan (DIOVAN) 160 MG tablet Take 160 mg by mouth daily.     No facility-administered medications prior to visit.    Radiology:   No results found.  Cardiac Studies:   Lexiscan myoview stress test 02/21/2018: 1. Lexiscan stress test performed. Exercise capacity not assessed. Stress symptoms included dyspnea. Peak BP 160/86 mmHg. 2. The overall quality of the study is fair. Review of the raw data in a rotational cine format reveals diaphragmatic attenuation. Left ventricular cavity is noted to be normal on the rest and stress studies. Gated SPECT images reveal normal myocardial thickening and wall motion. The left ventricular ejection fraction was calculated to be 43%, although visually appears normal. Small size mild intensity perfusion defect in apical inferolateral region seen on rest images, that improves on stress images. These defects are likely related to diaphragmatic attenuation. 3. Low to intermediate risk study due to decreased LVEF.  Echocardiogram 07/08/2020: Left ventricle cavity is normal in size. Moderate concentric hypertrophy of the left ventricle. Normal global wall motion. Normal LV systolic function with visual EF 50-55%. Doppler evidence of grade I (impaired) diastolic dysfunction, normal LAP.  Left atrial cavity is mildly dilated. Trileaflet aortic valve with mild calcification. Moderate (Grade II) aortic regurgitation. Moderate (Grade II) mitral regurgitation. Mild tricuspid regurgitation.   No evidence of pulmonary hypertension. Compared to previous study on 09/14/2018, dilated aortic root not appreciated on this study.  EKG  EKG 08/04/2021: Normal sinus rhythm at rate of 92 bpm, left atrial enlargement, left axis deviation, left anterior fascicular block.  Right bundle branch block.  Poor R wave progression, anterolateral infarct old.  No evidence of ischemia.  IVCD, borderline criteria for LVH.  No significant change from 06/20/2020.  Assessment     ICD-10-CM   1. Primary hypertension  I10 EKG 12-Lead    2. Moderate aortic regurgitation  I35.1     3. Moderate mitral regurgitation  I34.0  4. Stage 3a chronic kidney disease (HCC)  N18.31        There are no discontinued medications.   No orders of the defined types were placed in this encounter.  Recommendations:   Anthony Clay is a 87 y.o. Caucasian male with hypertension, hyperlipidemia, moderate mitral and aortic regurgitation presents here for annual visit.  He is asymptomatic, his activity is limited due to back pain.  He is accompanied by his wife.  Today on examination, no change in the aortic regurgitation murmur.  I cannot appreciate a mitral regurgitation murmur.  Heart sounds are distant.  No clinical evidence of heart failure.  He has not had any further leg edema since amlodipine was discontinued.  I reviewed his external labs, renal function is remained stable.  He is presently on valsartan and also Maxide and potassium levels are stable as well.  As he is asymptomatic from the valvular heart disease, would not recommend any further evaluation with regard to this.  I have recommended that he see me back on a as needed basis if he develops shortness of breath, worsening leg edema, PND or orthopnea.  We discussed regarding increasing his physical activity, especially it would be helpful if he were to join the [facility] and walking the pool, water aerobics would be very appropriate in a patient with chronic  back pain.   ds.    Adrian Prows, MD, [facility] 08/04/2021, 10:48 AM Office: [phone]

## 2021-08-12 ENCOUNTER — Telehealth: Payer: Self-pay | Admitting: Cardiology

## 2021-08-12 ENCOUNTER — Other Ambulatory Visit: Payer: Self-pay

## 2021-08-12 DIAGNOSIS — E7800 Pure hypercholesterolemia, unspecified: Secondary | ICD-10-CM

## 2021-08-12 MED ORDER — ROSUVASTATIN CALCIUM 5 MG PO TABS: 5.0000 mg | ORAL_TABLET | Freq: Every day | ORAL | 0 refills | Status: DC

## 2021-08-12 NOTE — Telephone Encounter (Signed)
Done

## 2021-08-12 NOTE — Telephone Encounter (Signed)
Patient's spouse requesting refill for rosuvastatin on patient's behalf.

## 2021-09-15 ENCOUNTER — Other Ambulatory Visit: Payer: Self-pay | Admitting: Cardiology

## 2021-09-15 DIAGNOSIS — E7800 Pure hypercholesterolemia, unspecified: Secondary | ICD-10-CM

## 2021-11-04 ENCOUNTER — Other Ambulatory Visit: Payer: Self-pay | Admitting: Cardiology

## 2021-11-04 DIAGNOSIS — I10 Essential (primary) hypertension: Secondary | ICD-10-CM

## 2021-12-06 IMAGING — US US EXTREM LOW VENOUS*R*
1 series · 13 of 24 positions shown · non-contrast
Comparison: None.

CLINICAL DATA: 89-year-old male with a history of localized
swelling right lower leg



[Series 1: us extrem low venous*right* · 0.07mm/px · 13 of 38 slices shown]
[im 1/38]
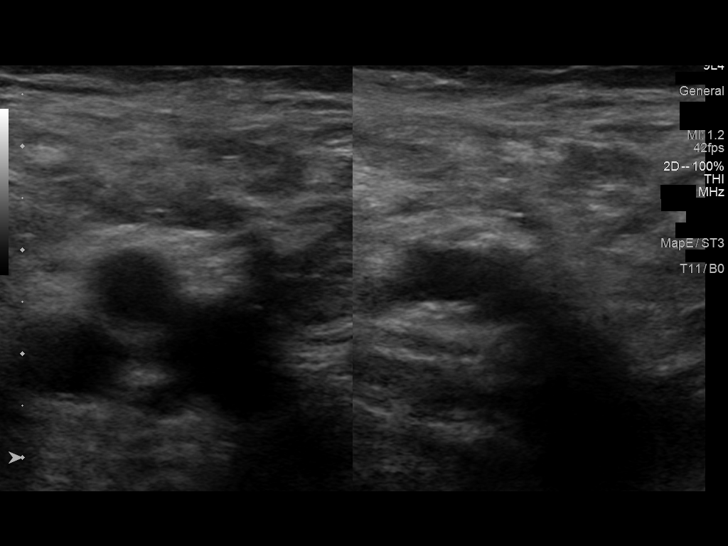
[im 4/38]
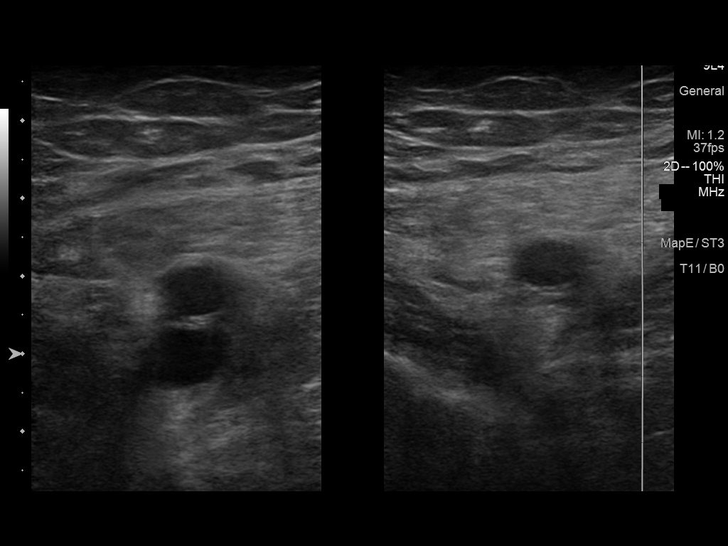
[im 7/38]
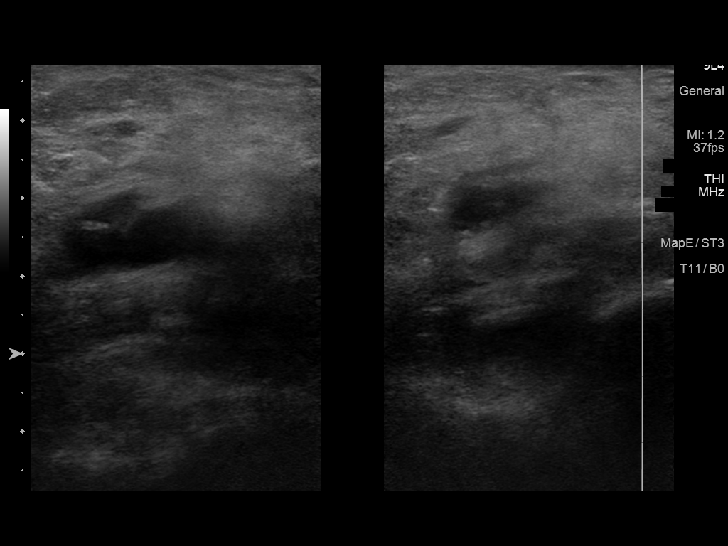
[im 10/38]
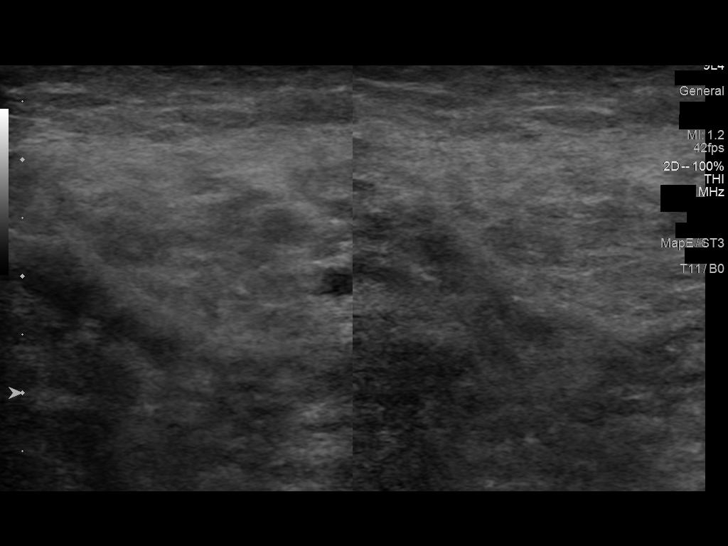
[im 13/38]
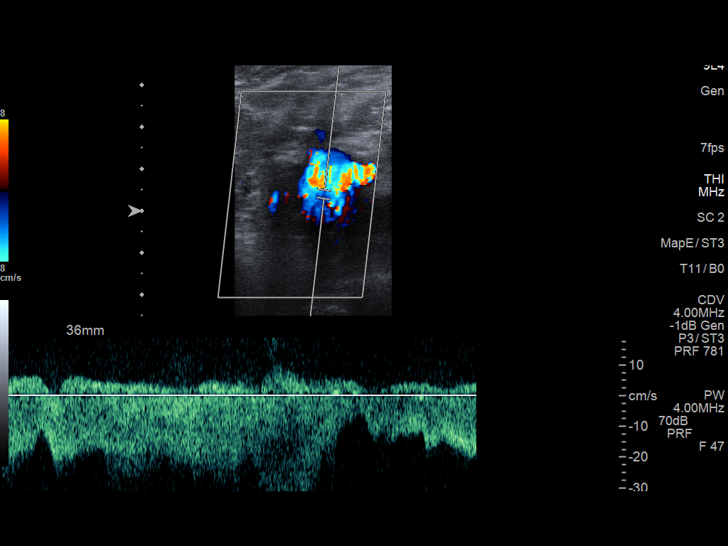
[im 17/38]
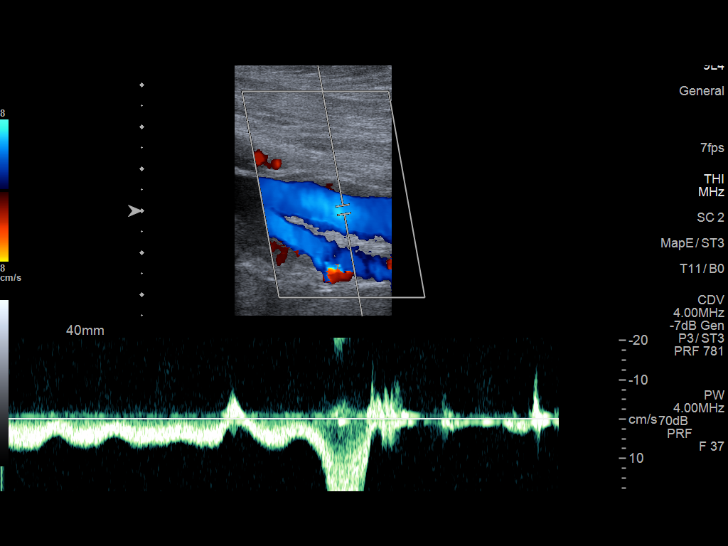
[im 20/38]
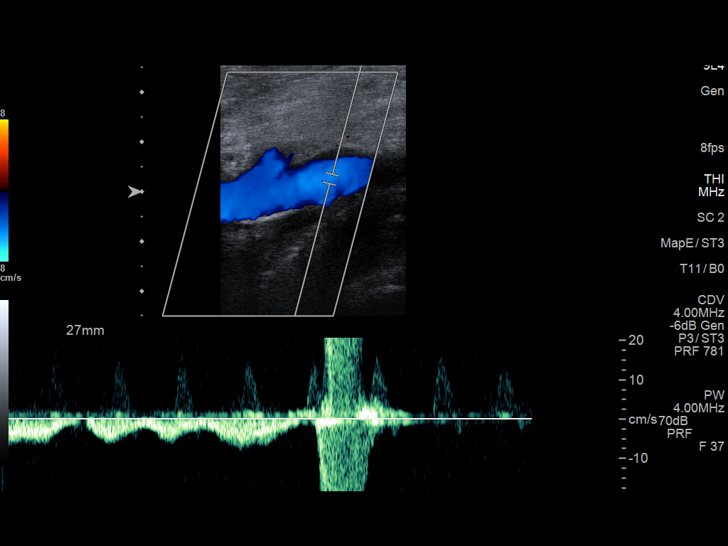
[im 21/38]
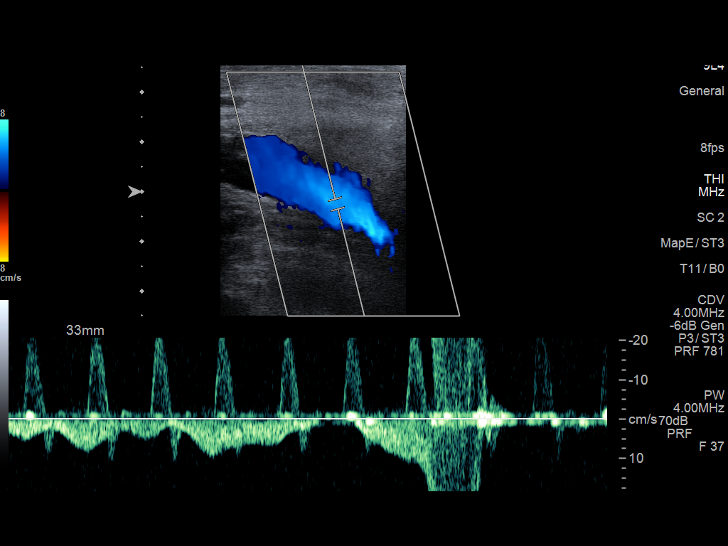
[im 25/38]
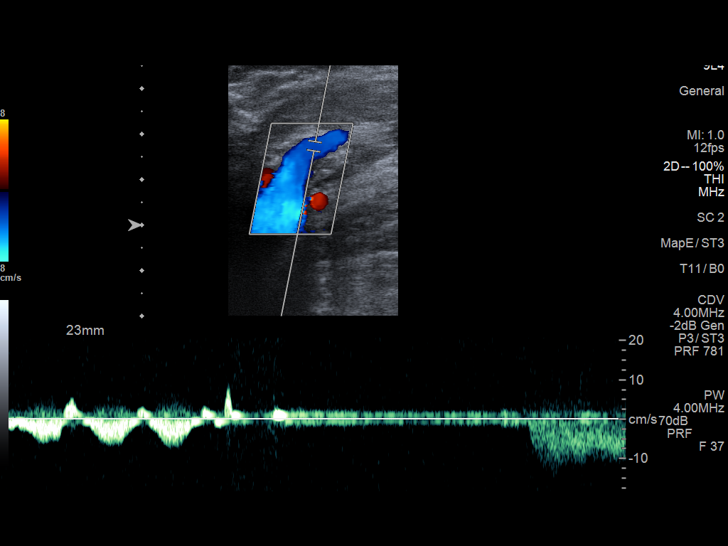
[im 28/38]
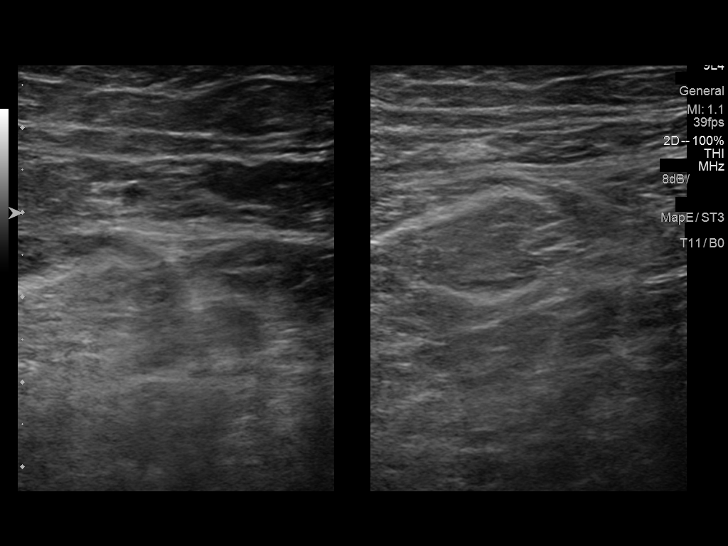
[im 31/38]
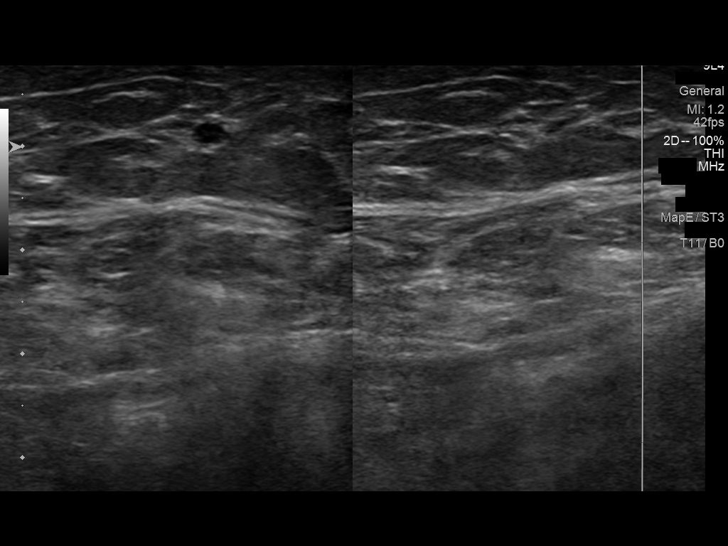
[im 34/38]
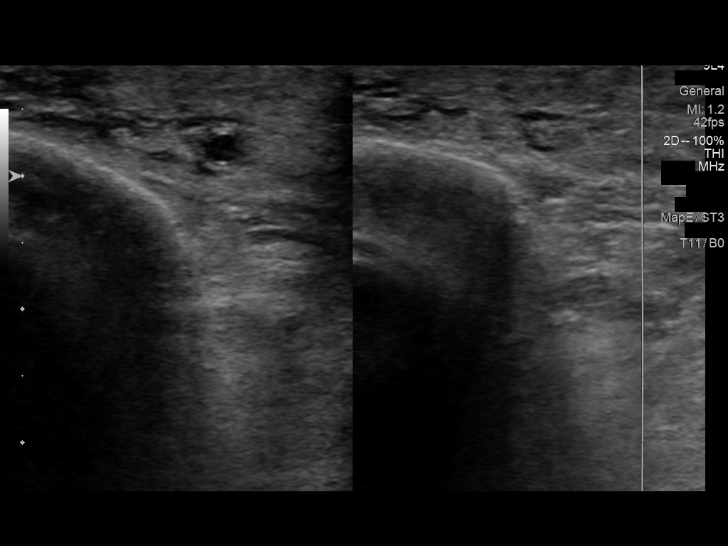
[im 38/38]
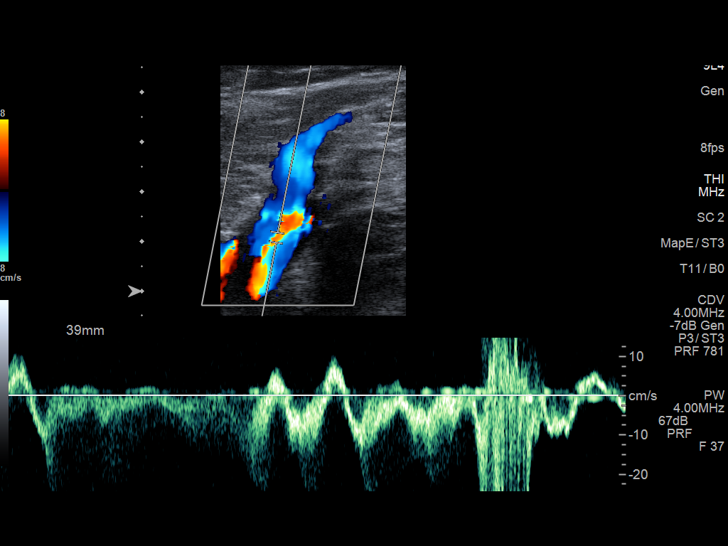

[13 of 24 positions shown; findings below may reference images not displayed]

FINDINGS: Contralateral Common Femoral Vein: Respiratory phasicity is normal
and symmetric with the symptomatic side. No evidence of thrombus.
Normal compressibility.

Common Femoral Vein: No evidence of thrombus. Normal
compressibility, respiratory phasicity and response to augmentation.

Saphenofemoral Junction: No evidence of thrombus. Normal
compressibility and flow on color Doppler imaging.

Profunda Femoral Vein: No evidence of thrombus. Normal
compressibility and flow on color Doppler imaging.

Femoral Vein: No evidence of thrombus. Normal compressibility,
respiratory phasicity and response to augmentation.

Popliteal Vein: No evidence of thrombus. Normal compressibility,
respiratory phasicity and response to augmentation.

Calf Veins: No evidence of thrombus. Normal compressibility and flow
on color Doppler imaging.

Superficial Great Saphenous Vein: No evidence of thrombus. Normal
compressibility and flow on color Doppler imaging.

Other Findings:  None.
IMPRESSION: Sonographic survey of the right lower extremity negative for DVT

## 2022-08-17 ENCOUNTER — Other Ambulatory Visit: Payer: Self-pay

## 2022-08-17 DIAGNOSIS — E7800 Pure hypercholesterolemia, unspecified: Secondary | ICD-10-CM

## 2022-08-17 MED ORDER — ROSUVASTATIN CALCIUM 5 MG PO TABS: 5.0000 mg | ORAL_TABLET | Freq: Every day | ORAL | 3 refills | Status: DC

## 2022-09-30 ENCOUNTER — Encounter: Payer: Self-pay | Admitting: Cardiology

## 2022-09-30 ENCOUNTER — Ambulatory Visit: Payer: Medicare Other | Admitting: Cardiology

## 2022-09-30 VITALS — BP 124/75 | HR 99 | Resp 16 | Ht 71.0 in | Wt 220.0 lb

## 2022-09-30 DIAGNOSIS — I340 Nonrheumatic mitral (valve) insufficiency: Secondary | ICD-10-CM

## 2022-09-30 DIAGNOSIS — N1831 Chronic kidney disease, stage 3a: Secondary | ICD-10-CM

## 2022-09-30 DIAGNOSIS — I453 Trifascicular block: Secondary | ICD-10-CM

## 2022-09-30 DIAGNOSIS — I10 Essential (primary) hypertension: Secondary | ICD-10-CM

## 2022-09-30 DIAGNOSIS — I351 Nonrheumatic aortic (valve) insufficiency: Secondary | ICD-10-CM

## 2022-09-30 NOTE — Progress Notes (Unsigned)
Primary Physician/Referring:  Aletha Halim., PA-C  Patient ID: Anthony Clay, male    DOB: [birth year], 88 y.o.   MRN: [id number]  Chief Complaint  Patient presents with   Hypertension   Follow-up    1 year   HPI:    Anthony Clay  is a 88 y.o. Caucasian male with hypertension, hyperlipidemia, moderate mitral and aortic regurgitation presents here for annual visit.  He is asymptomatic, his activity is limited due to back pain. His activity is also limited due to severe degenerative joint disease especially his bilateral knee arthritis.  Past Medical History:  Diagnosis Date   Allergy    Anxiety    ?? d/t cortisone shot   Arthritis    Chronic low back pain    GERD (gastroesophageal reflux disease)    occasional--takes  OTC   Hypertension    Osteoarthritis    Peripheral neuropathy 11/15/2018   Past Surgical History:  Procedure Laterality Date   APPENDECTOMY     BACK SURGERY  2015   L 3-5 fusion with cages and screws   EYE SURGERY     bilateral cataracts   HERNIA REPAIR     bilateral   ROTATOR CUFF REPAIR Left 2012   TONSILLECTOMY     Family History  Problem Relation Age of Onset   Colon cancer Father    Cancer Mother    Neuropathy Neg Hx     Social History   Tobacco Use   Smoking status: Former    Packs/day: 0.50    Years: 10.00    Total pack years: 5.00    Types: Cigarettes    Quit date: 05/01/1978    Years since quitting: 44.4   Smokeless tobacco: Never  Substance Use Topics   Alcohol use: Yes    Alcohol/week: 3.0 standard drinks of alcohol    Types: 3 Glasses of wine per week    Comment: occasiionally   Marital Status: Married  ROS  Review of Systems  Cardiovascular:  Negative for chest pain, dyspnea on exertion and leg swelling.  Musculoskeletal:  Positive for back pain and joint pain.  Gastrointestinal:  Negative for melena.   Objective  Blood pressure 124/75, pulse 99, resp. rate 16, height _0  (1.803 m), weight 220 lb (99.8  kg), SpO2 94 %.     09/30/2022   11:31 AM 08/04/2021    9:57 AM 08/02/2020   10:13 AM  Vitals with BMI  Height _1  _2  _3   Weight 220 lbs 210 lbs 6 oz 204 lbs  BMI 30.7 82.95 62.13  Systolic 086 578 469  Diastolic 75 62 70  Pulse 99 100 67     Physical Exam Neck:     Vascular: No carotid bruit or JVD.  Cardiovascular:     Rate and Rhythm: Normal rate and regular rhythm.     Pulses: Intact distal pulses.          Popliteal pulses are 2+ on the right side and 2+ on the left side.       Dorsalis pedis pulses are 1+ on the right side and 1+ on the left side.       Posterior tibial pulses are 1+ on the right side and 1+ on the left side.     Heart sounds: Heart sounds are distant. Murmur heard.     Early systolic murmur is present with a grade of 2/6 at the upper right sternal border.  Diastolic murmur is present.     No gallop.  Pulmonary:     Effort: Pulmonary effort is normal.     Breath sounds: Examination of the right-lower field reveals rales. Examination of the left-lower field reveals rales. Rales present.  Abdominal:     General: Bowel sounds are normal.     Palpations: Abdomen is soft.  Musculoskeletal:     Right lower leg: Edema (Trace ankle edema) present.     Left lower leg: No edema.    Laboratory examination:   External labs:   Labs 08/15/2022:  Sodium 138, potassium 4.4, BUN 25, creatinine 1.45, EGFR 40 Flaminal, LFTs normal.  Total cholesterol 142, triglycerides 128, HDL 49, LDL 72.  Hb 12.9/HCT 37.6, platelets 268.  Medications and allergies  No Known Allergies   Current Outpatient Medications:    B Complex Vitamins (VITAMIN B COMPLEX) TABS, Take 1 tablet by mouth daily., Disp: , Rfl:    bimatoprost (LUMIGAN) 0.01 % SOLN, Place 1 drop into both eyes at bedtime., Disp: , Rfl:    calcium carbonate (OS-CAL) 600 MG TABS tablet, Take 600 mg by mouth daily with breakfast. For calcium supplement, Disp: , Rfl:    ferrous sulfate 325 (65 FE) MG  EC tablet, Take 1 tablet by mouth every other day., Disp: , Rfl:    ibuprofen (ADVIL) 800 MG tablet, Take 1 tablet by mouth as needed., Disp: , Rfl:    Multiple Vitamin (MULTIVITAMIN WITH MINERALS) TABS tablet, Take 1 tablet by mouth daily. For supplement, Disp: , Rfl:    rosuvastatin (CRESTOR) 5 MG tablet, Take 1 tablet (5 mg total) by mouth daily., Disp: 90 tablet, Rfl: 3   timolol (TIMOPTIC) 0.5 % ophthalmic solution, Place 1 drop into both eyes every morning., Disp: , Rfl:    triamterene-hydrochlorothiazide (MAXZIDE-25) 37.5-25 MG tablet, TAKE 1 TABLET DAILY, Disp: 90 tablet, Rfl: 3   valsartan (DIOVAN) 160 MG tablet, Take 160 mg by mouth daily., Disp: , Rfl:    Radiology:   Chest x-ray two-view on 02/08/2018: Linear scarring remains at the lung bases. No pneumonia or pleural effusion is seen. Mediastinal and hilar contours are unremarkable. The heart is within upper limits normal and stable. Hardware for fusion of a portion of the lumbar spine is noted.  Cardiac Studies:   Lexiscan myoview stress test 02/21/2018: 1. Lexiscan stress test performed. Exercise capacity not assessed. Stress symptoms included dyspnea. Peak BP 160/86 mmHg. 2. The overall quality of the study is fair. Review of the raw data in a rotational cine format reveals diaphragmatic attenuation. Left ventricular cavity is noted to be normal on the rest and stress studies. Gated SPECT images reveal normal myocardial thickening and wall motion. The left ventricular ejection fraction was calculated to be 43%, although visually appears normal. Small size mild intensity perfusion defect in apical inferolateral region seen on rest images, that improves on stress images. These defects are likely related to diaphragmatic attenuation. 3. Low to intermediate risk study due to decreased LVEF.  Echocardiogram 07/08/2020: Left ventricle cavity is normal in size. Moderate concentric hypertrophy of the left ventricle. Normal global wall  motion. Normal LV systolic function with visual EF 50-55%. Doppler evidence of grade I (impaired) diastolic dysfunction, normal LAP.  Left atrial cavity is mildly dilated. Trileaflet aortic valve with mild calcification. Moderate (Grade II) aortic regurgitation. Moderate (Grade II) mitral regurgitation. Mild tricuspid regurgitation.  No evidence of pulmonary hypertension. Compared to previous study on 09/14/2018, dilated aortic root not appreciated on this  study.  EKG  EKG 09/30/2022: Sinus rhythm with first-degree AV block at rate of 90 bpm, left atrial enlargement, left axis deviation, left anterior fascicular block.  Right bundle branch block.  Trifascicular block.  Poor R wave progression, cannot exclude anteroseptal infarct old.  Low-voltage complexes.  Pulmonary disease pattern.  Compared to 08/04/2022, no significant change.  Assessment     ICD-10-CM   1. Primary hypertension  I10 EKG 12-Lead    2. Moderate aortic regurgitation  I35.1     3. 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 Stage 3a chronic kidney disease (HCC)  N18.31        There are no discontinued medications.   No orders of the defined types were placed in this encounter.  Recommendations:   BLAIR LUNDEEN is a 88 y.o. Caucasian male with hypertension, hyperlipidemia, moderate mitral and aortic regurgitation presents here for annual visit.  He is asymptomatic, his activity is limited due to back pain.  He is accompanied by his wife.  Today on examination, no change in the aortic regurgitation murmur.  I cannot appreciate a mitral regurgitation murmur.  Heart sounds are distant.  No clinical evidence of heart failure.  He has not had any further leg edema since amlodipine was discontinued.  I reviewed his external labs, renal function is remained stable.  He is presently on valsartan and also Maxide and potassium levels are stable as well.  As he is asymptomatic from the  valvular heart disease, would not recommend any further evaluation with regard to this.  I have recommended that he see me back on a as needed basis if he develops shortness of breath, worsening leg edema, PND or orthopnea.  We discussed regarding increasing his physical activity, especially it would be helpful if he were to join the [facility] and walking the pool, water aerobics would be very appropriate in a patient with chronic back pain.   ds.    Adrian Prows, MD, [facility] 09/30/2022, 12:01 PM Office: [phone]

## 2023-01-18 ENCOUNTER — Other Ambulatory Visit: Payer: Self-pay

## 2023-01-18 DIAGNOSIS — I10 Essential (primary) hypertension: Secondary | ICD-10-CM

## 2023-01-18 MED ORDER — TRIAMTERENE-HCTZ 37.5-25 MG PO TABS: 1.0000 | ORAL_TABLET | Freq: Every day | ORAL | 2 refills | Status: DC

## 2023-03-15 ENCOUNTER — Ambulatory Visit
Admission: RE | Admit: 2023-03-15 | Discharge: 2023-03-15 | Disposition: A | Discharge status: Home / Self Care | Payer: Medicare Other | Source: Ambulatory Visit | Attending: Family Medicine | Admitting: Family Medicine

## 2023-03-15 ENCOUNTER — Other Ambulatory Visit: Payer: Self-pay | Admitting: Family Medicine

## 2023-03-15 DIAGNOSIS — M545 Low back pain, unspecified: Secondary | ICD-10-CM

## 2023-04-13 ENCOUNTER — Emergency Department (HOSPITAL_BASED_OUTPATIENT_CLINIC_OR_DEPARTMENT_OTHER)
Admission: EM | Admit: 2023-04-13 | Discharge: 2023-04-13 | Disposition: A | Discharge status: Home / Self Care | Payer: Medicare Other | Attending: Emergency Medicine | Admitting: Emergency Medicine

## 2023-04-13 ENCOUNTER — Emergency Department (HOSPITAL_BASED_OUTPATIENT_CLINIC_OR_DEPARTMENT_OTHER): Payer: Medicare Other | Admitting: Radiology

## 2023-04-13 ENCOUNTER — Other Ambulatory Visit (HOSPITAL_BASED_OUTPATIENT_CLINIC_OR_DEPARTMENT_OTHER): Payer: Self-pay

## 2023-04-13 ENCOUNTER — Encounter (HOSPITAL_BASED_OUTPATIENT_CLINIC_OR_DEPARTMENT_OTHER): Payer: Self-pay

## 2023-04-13 ENCOUNTER — Other Ambulatory Visit: Payer: Self-pay

## 2023-04-13 DIAGNOSIS — N179 Acute kidney failure, unspecified: Secondary | ICD-10-CM

## 2023-04-13 DIAGNOSIS — I10 Essential (primary) hypertension: Secondary | ICD-10-CM | POA: Diagnosis not present

## 2023-04-13 DIAGNOSIS — Z79899 Other long term (current) drug therapy: Secondary | ICD-10-CM | POA: Insufficient documentation

## 2023-04-13 DIAGNOSIS — R0602 Shortness of breath: Secondary | ICD-10-CM | POA: Diagnosis present

## 2023-04-13 LAB — CBC WITH DIFFERENTIAL/PLATELET
Abs Immature Granulocytes: 0.19 10*3/uL — ABNORMAL HIGH (ref 0.00–0.07)
Basophils Absolute: 0.1 10*3/uL (ref 0.0–0.1)
Basophils Relative: 1 %
Eosinophils Absolute: 0.3 10*3/uL (ref 0.0–0.5)
Eosinophils Relative: 3 %
HCT: 37.9 % — ABNORMAL LOW (ref 39.0–52.0)
Hemoglobin: 13.2 g/dL (ref 13.0–17.0)
Immature Granulocytes: 2 %
Lymphocytes Relative: 17 %
Lymphs Abs: 1.5 10*3/uL (ref 0.7–4.0)
MCH: 34.1 pg — ABNORMAL HIGH (ref 26.0–34.0)
MCHC: 34.8 g/dL (ref 30.0–36.0)
MCV: 97.9 fL (ref 80.0–100.0)
Monocytes Absolute: 1.1 10*3/uL — ABNORMAL HIGH (ref 0.1–1.0)
Monocytes Relative: 12 %
Neutro Abs: 5.8 10*3/uL (ref 1.7–7.7)
Neutrophils Relative %: 65 %
Platelets: 285 10*3/uL (ref 150–400)
RBC: 3.87 MIL/uL — ABNORMAL LOW (ref 4.22–5.81)
RDW: 13.5 % (ref 11.5–15.5)
WBC: 9 10*3/uL (ref 4.0–10.5)
nRBC: 0 % (ref 0.0–0.2)

## 2023-04-13 LAB — COMPREHENSIVE METABOLIC PANEL
ALT: 17 U/L (ref 0–44)
AST: 18 U/L (ref 15–41)
Albumin: 4 g/dL (ref 3.5–5.0)
Alkaline Phosphatase: 65 U/L (ref 38–126)
Anion gap: 11 (ref 5–15)
BUN: 43 mg/dL — ABNORMAL HIGH (ref 8–23)
CO2: 26 mmol/L (ref 22–32)
Calcium: 9.4 mg/dL (ref 8.9–10.3)
Chloride: 102 mmol/L (ref 98–111)
Creatinine, Ser: 1.91 mg/dL — ABNORMAL HIGH (ref 0.61–1.24)
GFR, Estimated: 32 mL/min — ABNORMAL LOW (ref >60)
Glucose, Bld: 88 mg/dL (ref 70–99)
Potassium: 3.7 mmol/L (ref 3.5–5.1)
Sodium: 139 mmol/L (ref 135–145)
Total Bilirubin: 0.8 mg/dL (ref 0.3–1.2)
Total Protein: 6.6 g/dL (ref 6.5–8.1)

## 2023-04-13 LAB — TROPONIN I (HIGH SENSITIVITY)
Troponin I (High Sensitivity): 39 ng/L — ABNORMAL HIGH (ref <18)
Troponin I (High Sensitivity): 36 ng/L — ABNORMAL HIGH (ref <18)

## 2023-04-13 LAB — BRAIN NATRIURETIC PEPTIDE: B Natriuretic Peptide: 82.5 pg/mL (ref 0.0–100.0)

## 2023-04-13 MED ORDER — SODIUM CHLORIDE 0.9 % IV BOLUS
500.0000 mL | Freq: Once | INTRAVENOUS | Status: AC
  Administered 2023-04-13: 500 mL via INTRAVENOUS

## 2023-04-13 NOTE — ED Triage Notes (Signed)
Patient here POV from Home.  Endorses SOB since yesterday. Mostly constant. No known fevers. Mild Cough. No CP.   NAD Noted during Triage. A&Ox4. GCS 15. BIB Wheelchair.

## 2023-04-13 NOTE — ED Provider Notes (Signed)
[place] EMERGENCY DEPARTMENT AT [facility] Provider Note   CSN: [id number] Arrival date & time: 04/13/23  1158     History  Chief Complaint  Patient presents with   Shortness of Breath    Anthony Clay is a 89 y.o. male.  Patient is a 89 year old male with a past medical history of hypertension and hyperlipidemia presenting to the emergency department with shortness of breath.  Patient reports that he has been having shortness of breath that mostly occurs at night.  He states that he sleeps sitting up at an angle.  He states that he wakes up with shortness of breath but is not gasping for air.  He denies significant dyspnea on exertion.  He denies any chest pain.  He denies any fever or cough.  He denies any lower extremity swelling.  He denies any history of blood clots, recent hospitalization or surgery, recent long travel in the car or plane, hormone use or cancer history.  He denies any recent nausea, vomiting or diarrhea or recent medication changes.  The history is provided by the patient and the spouse.  Shortness of Breath      Home Medications Prior to Admission medications   Medication Sig Start Date End Date Taking? Authorizing Provider  B Complex Vitamins (VITAMIN B COMPLEX) TABS Take 1 tablet by mouth daily.    [provider]  bimatoprost (LUMIGAN) 0.01 % SOLN Place 1 drop into both eyes at bedtime. 03/09/16   [provider]  calcium carbonate (OS-CAL) 600 MG TABS tablet Take 600 mg by mouth daily with breakfast. For calcium supplement    [provider]  ferrous sulfate 325 (65 FE) MG EC tablet Take 1 tablet by mouth every other day. 03/09/16   [provider]  ibuprofen (ADVIL) 800 MG tablet Take 1 tablet by mouth as needed.    [provider]  Multiple Vitamin (MULTIVITAMIN WITH MINERALS) TABS tablet Take 1 tablet by mouth daily. For supplement    [provider]  rosuvastatin (CRESTOR) 5 MG tablet  Take 1 tablet (5 mg total) by mouth daily. 08/17/22   Yates Decamp, MD  timolol (TIMOPTIC) 0.5 % ophthalmic solution Place 1 drop into both eyes every morning. 08/10/17   [provider]  triamterene-hydrochlorothiazide (MAXZIDE-25) 37.5-25 MG tablet Take 1 tablet by mouth daily. 01/18/23   Yates Decamp, MD  valsartan (DIOVAN) 160 MG tablet Take 160 mg by mouth daily. 06/13/20   [provider]      Allergies    Patient has no known allergies.    Review of Systems   Review of Systems  Respiratory:  Positive for shortness of breath.     Physical Exam Updated Vital Signs BP 120/71 (BP Location: Left Arm)   Pulse 82   Temp 99 F (37.2 C) (Oral)   Resp 18   Ht 6' (1.829 m)   Wt 97.5 kg   SpO2 95%   BMI 29.16 kg/m  Physical Exam Vitals and nursing note reviewed.  Constitutional:      General: He is not in acute distress.    Appearance: He is well-developed.  HENT:     Head: Normocephalic and atraumatic.     Mouth/Throat:     Mouth: Mucous membranes are moist.  Eyes:     Extraocular Movements: Extraocular movements intact.  Cardiovascular:     Rate and Rhythm: Normal rate and regular rhythm.     Pulses: Normal pulses.     Heart  sounds: Normal heart sounds.  Pulmonary:     Effort: Pulmonary effort is normal.     Breath sounds: Normal breath sounds.  Abdominal:     Palpations: Abdomen is soft.     Tenderness: There is no abdominal tenderness.  Musculoskeletal:        General: Normal range of motion.     Cervical back: Normal range of motion and neck supple.     Right lower leg: No edema.     Left lower leg: No edema.  Skin:    General: Skin is warm and dry.  Neurological:     General: No focal deficit present.     Mental Status: He is alert and oriented to person, place, and time.  Psychiatric:        Mood and Affect: Mood normal.        Behavior: Behavior normal.     ED Results / Procedures / Treatments   Labs (all labs ordered are listed, but only  abnormal results are displayed) Labs Reviewed  COMPREHENSIVE METABOLIC PANEL - Abnormal; Notable for the following components:      Result Value   BUN 43 (*)    Creatinine, Ser 1.91 (*)    GFR, Estimated 32 (*)    All other components within normal limits  CBC WITH DIFFERENTIAL/PLATELET - Abnormal; Notable for the following components:   RBC 3.87 (*)    HCT 37.9 (*)    MCH 34.1 (*)    Monocytes Absolute 1.1 (*)    Abs Immature Granulocytes 0.19 (*)    All other components within normal limits  TROPONIN I (HIGH SENSITIVITY) - Abnormal; Notable for the following components:   Troponin I (High Sensitivity) 39 (*)    All other components within normal limits  TROPONIN I (HIGH SENSITIVITY) - Abnormal; Notable for the following components:   Troponin I (High Sensitivity) 36 (*)    All other components within normal limits  BRAIN NATRIURETIC PEPTIDE    EKG EKG Interpretation  Date/Time:  Tuesday April 13 2023 12:05:24 EDT Ventricular Rate:  93 PR Interval:  221 QRS Duration: 141 QT Interval:  391 QTC Calculation: 487 R Axis:   -77 Text Interpretation: Sinus rhythm 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 Heart is stomach contours within limits. No acute confluent opacities or effusions. No acute bony abnormality. IMPRESSION: Bibasilar scarring.  No active disease. Electronically Signed   By: Charlett Nose M.D.   On: 04/13/2023 13:35    Procedures Procedures    Medications Ordered in ED Medications  sodium chloride 0.9 % bolus 500 mL (0 mLs Intravenous Stopped 04/13/23 1403)    ED Course/ Medical Decision Making/ A&P Clinical Course as of 04/13/23 1540  Tue Apr 13, 2023  1338 No significant  abnormalities on CXR. Initial troponin mildly elevated and will have repeat 2 hr trop. [VK]  1346 Labs with a mild AKI, he is receiving IVF. [VK]  1518 Patients troponin is downtrending. [VK]  1526 Patient's blood pressure has remained in the 120s since receiving the fluids. [VK]    Clinical Course User Index [VK] Rexford Maus, DO  Medical Decision Making This patient presents to the ED with chief complaint(s) of shortness of breath with pertinent past medical history of HTN, HLD which further complicates the presenting complaint. The complaint involves an extensive differential diagnosis and also carries with it a high risk of complications and morbidity.    The differential diagnosis includes ACS, arrhythmia, anemia, pneumonia, pneumothorax, pulmonary edema, pleural effusion, new onset CHF, dehydration, electrolyte abnormality  Additional history obtained: Additional history obtained from spouse Records reviewed Primary Care Documents and outpatient cardiology  ED Course and Reassessment: On patient's arrival to the emergency department he initially had soft blood pressures in the 80s to 100s but is otherwise satting well on room air no acute distress.  The patient appeared minimally euvolemic on exam and was started on 500 cc of IV fluids.  EKG on arrival showed normal sinus rhythm without acute ischemic changes.  Patient will additionally have labs including troponin and chest x-ray performed and he will be closely reassessed.  Independent labs interpretation:  The following labs were independently interpreted: Flat troponin, AKI on CKD  Independent visualization of imaging: - I independently visualized the following imaging with scope of interpretation limited to determining acute life threatening conditions related to emergency care: Chest x-ray, which revealed no acute disease  Consultation: - Consulted or discussed management/test interpretation  w/ external professional: N/A  Consideration for admission or further workup: Using shared decision making with the patient he would prefer discharge home with outpatient cardiology and primary care follow-up and is given strict return precautions Social Determinants of health: N/A    Amount and/or Complexity of Data Reviewed Labs: ordered. Radiology: ordered.          Final Clinical Impression(s) / ED Diagnoses Final diagnoses:  Shortness of breath  AKI (acute kidney injury) [facility])    Rx / DC Orders ED Discharge Orders     None         Rexford Maus, DO 04/13/23 1540

## 2023-04-13 NOTE — Discharge Instructions (Signed)
You were seen in the emergency department for your shortness of breath.  Your blood pressure was initially low when you got here and your kidney function is slightly worse than usual today.  We gave you some fluids with some improvement.  You should cut your valsartan dose in half and only take 80 mg until you can follow-up with your primary doctor or cardiologist ideally within the next week to have your blood pressure and kidney function were checked.  You should also see your cardiologist regarding your shortness of breath and see if you may need a repeat echo or further workup.  You should return to the emergency department if your shortness of breath is getting significantly worse, you have severe chest pain, you are not urinating, you become dizzy or pass out or if you have other new or concerning symptoms.

## 2023-04-20 ENCOUNTER — Ambulatory Visit: Payer: Medicare Other | Admitting: Cardiology

## 2023-04-20 ENCOUNTER — Encounter: Payer: Self-pay | Admitting: Cardiology

## 2023-04-20 VITALS — BP 108/65 | HR 94 | Resp 16 | Ht 72.0 in | Wt 197.8 lb

## 2023-04-20 DIAGNOSIS — I10 Essential (primary) hypertension: Secondary | ICD-10-CM

## 2023-04-20 DIAGNOSIS — R42 Dizziness and giddiness: Secondary | ICD-10-CM

## 2023-04-20 DIAGNOSIS — R0609 Other forms of dyspnea: Secondary | ICD-10-CM

## 2023-04-20 NOTE — Progress Notes (Signed)
Primary Physician/Referring:  Richmond Campbell., PA-C  Patient ID: Anthony Clay, male    DOB: 08-Aug-1928, 89 y.o.   MRN: [id number]  Chief Complaint  Patient presents with   Hypertension   Follow-up   HPI:    Anthony Clay  is a 89 y.o. Caucasian male with hypertension with stage IIIa chronic kidney disease, trifascicular block, hyperlipidemia, moderate mitral and aortic regurgitation presents here for post emergency room visit on 04/13/2023 when he presented with dyspnea and fatigue.  Found to be hypotensive, was hydrated with IV fluids and discharged home after reducing valsartan from 160 mg to 80 mg daily.  He has also been losing weight. He is accompanied by his wife.  Past Medical History:  Diagnosis Date   Allergy    Anxiety    ?? d/t cortisone shot   Arthritis    Chronic low back pain    GERD (gastroesophageal reflux disease)    occasional--takes  OTC   Hypertension    Osteoarthritis    Peripheral neuropathy 11/15/2018   Past Surgical History:  Procedure Laterality Date   APPENDECTOMY     BACK SURGERY  2015   L 3-5 fusion with cages and screws   EYE SURGERY     bilateral cataracts   HERNIA REPAIR     bilateral   ROTATOR CUFF REPAIR Left 2012   TONSILLECTOMY     Family History  Problem Relation Age of Onset   Colon cancer Father    Cancer Mother    Neuropathy Neg Hx     Social History   Tobacco Use   Smoking status: Former    Packs/day: 0.50    Years: 10.00    Additional pack years: 0.00    Total pack years: 5.00    Types: Cigarettes    Quit date: 05/01/1978    Years since quitting: 45.0   Smokeless tobacco: Never  Substance Use Topics   Alcohol use: Yes    Alcohol/week: 3.0 standard drinks of alcohol    Types: 3 Glasses of wine per week    Comment: occasiionally   Marital Status: Married  ROS  Review of Systems  Constitutional: Positive for weight loss.  Cardiovascular:  Negative for chest pain, dyspnea on exertion and leg swelling.   Musculoskeletal:  Positive for back pain and joint pain.  Gastrointestinal:  Negative for melena.   Objective  Blood pressure 108/65, pulse 94, resp. rate 16, height 6' (1.829 m), weight 197 lb 12.8 oz (89.7 kg), SpO2 96 %.     04/20/2023    4:36 PM 04/13/2023    3:38 PM 04/13/2023   12:15 PM  Vitals with BMI  Height 6\' 0"     Weight 197 lbs 13 oz    BMI 26.82    Systolic 108 120   Diastolic 65 71   Pulse 94 82 94     Physical Exam Neck:     Vascular: No carotid bruit or JVD.  Cardiovascular:     Rate and Rhythm: Normal rate and regular rhythm.     Pulses: Intact distal pulses.          Popliteal pulses are 2+ on the right side and 2+ on the left side.       Dorsalis pedis pulses are 1+ on the right side and 1+ on the left side.       Posterior tibial pulses are 1+ on the right side and 1+ on the left side.  Heart sounds: Heart sounds are distant. Murmur heard.     Early systolic murmur is present with a grade of 2/6 at the upper right sternal border.     Diastolic murmur is present.     No gallop.  Pulmonary:     Effort: Pulmonary effort is normal.     Breath sounds: Examination of the right-lower field reveals rales. Examination of the left-lower field reveals rales. Rales present.  Abdominal:     General: Bowel sounds are normal.     Palpations: Abdomen is soft.  Musculoskeletal:     Right lower leg: Edema (Trace ankle edema) present.     Left lower leg: No edema.    Laboratory examination:   Lab Results  Component Value Date   NA 139 04/13/2023   K 3.7 04/13/2023   CO2 26 04/13/2023   GLUCOSE 88 04/13/2023   BUN 43 (H) 04/13/2023   CREATININE 1.91 (H) 04/13/2023   CALCIUM 9.4 04/13/2023   GFRNONAA 32 (L) 04/13/2023      Latest Ref Rng & Units 04/13/2023   12:47 PM 01/10/2014    4:51 PM 01/10/2014    2:20 PM  CBC  WBC 4.0 - 10.5 K/uL 9.0     Hemoglobin 13.0 - 17.0 g/dL 62.1  30.8  9.5   Hematocrit 39.0 - 52.0 % 37.9  31.0  28.0   Platelets 150 - 400 K/uL  285       External labs:   Labs 08/15/2022:  Sodium 138, potassium 4.4, BUN 25, creatinine 1.45, EGFR 40 Flaminal, LFTs normal.  Total cholesterol 142, triglycerides 128, HDL 49, LDL 72.  Hb 12.9/HCT 37.6, platelets 268.  Medications and allergies  No Known Allergies   Current Outpatient Medications:    B Complex Vitamins (VITAMIN B COMPLEX) TABS, Take 1 tablet by mouth daily., Disp: , Rfl:    bimatoprost (LUMIGAN) 0.01 % SOLN, Place 1 drop into both eyes at bedtime., Disp: , Rfl:    calcium carbonate (OS-CAL) 600 MG TABS tablet, Take 600 mg by mouth daily with breakfast. For calcium supplement, Disp: , Rfl:    ferrous sulfate 325 (65 FE) MG EC tablet, Take 1 tablet by mouth every other day., Disp: , Rfl:    ibuprofen (ADVIL) 800 MG tablet, Take 1 tablet by mouth as needed., Disp: , Rfl:    Multiple Vitamin (MULTIVITAMIN WITH MINERALS) TABS tablet, Take 1 tablet by mouth daily. For supplement, Disp: , Rfl:    rosuvastatin (CRESTOR) 5 MG tablet, Take 1 tablet (5 mg total) by mouth daily., Disp: 90 tablet, Rfl: 3   timolol (TIMOPTIC) 0.5 % ophthalmic solution, Place 1 drop into both eyes every morning., Disp: , Rfl:    Radiology:   Chest x-ray two-view on 02/08/2018: Linear scarring remains at the lung bases. No pneumonia or pleural effusion is seen. Mediastinal and hilar contours are unremarkable. The heart is within upper limits normal and stable. Hardware for fusion of a portion of the lumbar spine is noted.  Cardiac Studies:   Lexiscan myoview stress test 02/21/2018: 1. Lexiscan stress test performed. Exercise capacity not assessed. Stress symptoms included dyspnea. Peak BP 160/86 mmHg. 2. The overall quality of the study is fair. Review of the raw data in a rotational cine format reveals diaphragmatic attenuation. Left ventricular cavity is noted to be normal on the rest and stress studies. Gated SPECT images reveal normal myocardial thickening and wall motion. The left  ventricular ejection fraction was calculated to be 43%, although  visually appears normal. Small size mild intensity perfusion defect in apical inferolateral region seen on rest images, that improves on stress images. These defects are likely related to diaphragmatic attenuation. 3. Low to intermediate risk study due to decreased LVEF.  Echocardiogram 07/08/2020: Left ventricle cavity is normal in size. Moderate concentric hypertrophy of the left ventricle. Normal global wall motion. Normal LV systolic function with visual EF 50-55%. Doppler evidence of grade I (impaired) diastolic dysfunction, normal LAP.  Left atrial cavity is mildly dilated. Trileaflet aortic valve with mild calcification. Moderate (Grade II) aortic regurgitation. Moderate (Grade II) mitral regurgitation. Mild tricuspid regurgitation.  No evidence of pulmonary hypertension. Compared to previous study on 09/14/2018, dilated aortic root not appreciated on this study.  EKG  EKG 04/20/2023: Sinus rhythm with first-degree AV block at the rate of 90 bpm, left atrial enlargement, left axis deviation, left intrafascicular block.  Right bundle branch block.  Trifascicular block.  Low-voltage complexes.  Pulmonary disease pattern.  Compared to 09/30/2022, no significant change.  Assessment     ICD-10-CM   1. Primary hypertension  I10 EKG 12-Lead    2. Dyspnea on exertion  R06.09     3. Dizziness  R42        Medications Discontinued During This Encounter  Medication Reason   valsartan (DIOVAN) 80 MG tablet Discontinued by provider   triamterene-hydrochlorothiazide (MAXZIDE-25) 37.5-25 MG tablet Discontinued by provider     No orders of the defined types were placed in this encounter.  Recommendations:   Anthony Clay is a 89 y.o. Caucasian male with hypertension with stage IIIa chronic kidney disease, trifascicular block, hyperlipidemia, moderate mitral and aortic regurgitation presents here for post emergency room visit  on 04/13/2023 when he presented with dyspnea and fatigue.  Found to be hypotensive, was hydrated with IV fluids and discharged home after reducing valsartan from 160 mg to 80 mg daily.  He has also been losing weight.  1. Primary hypertension Patient's blood pressure is clearly soft.  I will discontinue valsartan 80 mg which is reduced from 160 mg in the emergency room.  I will also discontinue Maxide.  Patient has stage IIIa-B chronic kidney disease, ED labs on 04/11/2023 revealing worsening renal function.  His fatigue and dizziness could also be related to recent weight loss as patient has had lack of appetite.  He is presently 89 years of age and in 6 months he has lost 23 pounds in weight, which could be concerning.  I would like to see him back in 6 weeks to follow-up on weight loss and hypertension.  If he continues to lose weight, he may need further workup for the same which could be the etiology for hypotension and dizziness and fatigue.  He does have underlying trifascicular block that is old, no indication for pacemaker. - EKG 12-Lead  2. Dyspnea on exertion This is chronic, no clinical evidence of heart failure, no leg edema, lungs are clear except for mild bibasilar crackles probably due to atelectasis.  3. Dizziness Related to low blood pressure, hoping that discontinuing all antihypertensive medications may improve the symptoms.  Office visit in 6 weeks.  This was a 40-minute office visit encounter, review of ED visit, review of his labs PCP, and coordination of care.  Patient is scheduled to see Anthony Clay tomorrow, requested to not do any labs at least 2 to 3 weeks after making changes to his medications.   Anthony Decamp, MD, [facility] 04/20/2023, 6:21 PM Office: [phone] Fax: [phone] Pager:  [phone]  

## 2023-04-30 ENCOUNTER — Telehealth: Payer: Self-pay

## 2023-04-30 NOTE — Telephone Encounter (Signed)
Pt's wife called his bp for the last 2 days.   139/140 hr 84  139/83 hr 74

## 2023-04-30 NOTE — Telephone Encounter (Signed)
1st one an error and second is good. HR is great

## 2023-04-30 NOTE — Telephone Encounter (Signed)
Pt's wife aware.

## 2023-05-05 NOTE — Telephone Encounter (Signed)
Start back Valsartan 80 mg daily (160 mg 1/2 tablet) and Maxzide 37.5/25 mg daily

## 2023-05-05 NOTE — Telephone Encounter (Signed)
Bp today  145 /90 hr 82 , 143/87  Pt was upset its high his wife called to let you know

## 2023-05-11 NOTE — Telephone Encounter (Signed)
done

## 2023-06-01 ENCOUNTER — Ambulatory Visit: Payer: Medicare Other | Admitting: Cardiology

## 2023-06-01 ENCOUNTER — Encounter: Payer: Self-pay | Admitting: Cardiology

## 2023-06-01 VITALS — BP 108/68 | HR 80 | Resp 16 | Ht 72.0 in | Wt 200.0 lb

## 2023-06-01 DIAGNOSIS — N1831 Chronic kidney disease, stage 3a: Secondary | ICD-10-CM

## 2023-06-01 DIAGNOSIS — I453 Trifascicular block: Secondary | ICD-10-CM

## 2023-06-01 DIAGNOSIS — I10 Essential (primary) hypertension: Secondary | ICD-10-CM

## 2023-06-01 NOTE — Progress Notes (Signed)
Primary Physician/Referring:  Richmond Campbell., PA-C  Patient ID: Anthony Clay, male    DOB: November 09, 1928, 89 y.o.   MRN: [id number]  Chief Complaint  Patient presents with   Hypertension   Follow-up   HPI:    Anthony Clay  is a 89 y.o. Caucasian male with hypertension with stage IIIa chronic kidney disease, trifascicular block, hyperlipidemia, moderate mitral and aortic regurgitation presents here for 6 week visit.    He was admitted to emergency room about 2 months ago and he had developed acute renal failure.  He is now back on the medications, on valsartan 80 mg in the evening and Maxide in the morning, blood pressure under excellent control and remains asymptomatic.  He is accompanied by his wife.  Past Medical History:  Diagnosis Date   Allergy    Anxiety    ?? d/t cortisone shot   Arthritis    Chronic low back pain    GERD (gastroesophageal reflux disease)    occasional--takes  OTC   Hypertension    Osteoarthritis    Peripheral neuropathy 11/15/2018   Past Surgical History:  Procedure Laterality Date   APPENDECTOMY     BACK SURGERY  2015   L 3-5 fusion with cages and screws   EYE SURGERY     bilateral cataracts   HERNIA REPAIR     bilateral   ROTATOR CUFF REPAIR Left 2012   TONSILLECTOMY     Family History  Problem Relation Age of Onset   Colon cancer Father    Cancer Mother    Neuropathy Neg Hx     Social History   Tobacco Use   Smoking status: Former    Current packs/day: 0.00    Average packs/day: 0.5 packs/day for 10.0 years (5.0 ttl pk-yrs)    Types: Cigarettes    Start date: 05/01/1968    Quit date: 05/01/1978    Years since quitting: 45.1   Smokeless tobacco: Never  Substance Use Topics   Alcohol use: Yes    Alcohol/week: 3.0 standard drinks of alcohol    Types: 3 Glasses of wine per week    Comment: occasiionally   Marital Status: Married  ROS  Review of Systems  Constitutional: Positive for weight loss.  Cardiovascular:   Negative for chest pain, dyspnea on exertion and leg swelling.  Musculoskeletal:  Positive for back pain and joint pain.  Gastrointestinal:  Negative for melena.   Objective  Blood pressure 108/68, pulse 80, resp. rate 16, height 6' (1.829 m), weight 200 lb (90.7 kg), SpO2 94%.     06/01/2023   10:46 AM 04/20/2023    4:36 PM 04/13/2023    3:38 PM  Vitals with BMI  Height 6\' 0"  6\' 0"    Weight 200 lbs 197 lbs 13 oz   BMI 27.12 26.82   Systolic 108 108 846  Diastolic 68 65 71  Pulse 80 94 82     Physical Exam Neck:     Vascular: No carotid bruit or JVD.  Cardiovascular:     Rate and Rhythm: Normal rate and regular rhythm.     Pulses: Intact distal pulses.          Popliteal pulses are 2+ on the right side and 2+ on the left side.       Dorsalis pedis pulses are 1+ on the right side and 1+ on the left side.       Posterior tibial pulses are 1+ on the right side  and 1+ on the left side.     Heart sounds: Heart sounds are distant. Murmur heard.     Early systolic murmur is present with a grade of 2/6 at the upper right sternal border.     Diastolic murmur is present.     No gallop.  Pulmonary:     Effort: Pulmonary effort is normal.     Breath sounds: Examination of the right-lower field reveals rales. Examination of the left-lower field reveals rales. Rales present.  Abdominal:     General: Bowel sounds are normal.     Palpations: Abdomen is soft.  Musculoskeletal:     Right lower leg: Edema (Trace ankle edema) present.     Left lower leg: No edema.    Laboratory examination:   Lab Results  Component Value Date   NA 139 04/13/2023   K 3.7 04/13/2023   CO2 26 04/13/2023   GLUCOSE 88 04/13/2023   BUN 43 (H) 04/13/2023   CREATININE 1.91 (H) 04/13/2023   CALCIUM 9.4 04/13/2023   GFRNONAA 32 (L) 04/13/2023   External labs:   Labs 05/12/2023:  Sodium 136, potassium 3.9, BUN 18, creatinine 1.24, EGFR 54 mL, LFTs reveal mild elevation in AST at 56 and ALT at 57.  Labs  08/15/2022:  Sodium 138, potassium 4.4, BUN 25, creatinine 1.45, EGFR 40 Flaminal, LFTs normal.  Total cholesterol 142, triglycerides 128, HDL 49, LDL 72.  Hb 12.9/HCT 37.6, platelets 268.  Medications and allergies  No Known Allergies   Current Outpatient Medications:    B Complex Vitamins (VITAMIN B COMPLEX) TABS, Take 1 tablet by mouth daily., Disp: , Rfl:    bimatoprost (LUMIGAN) 0.01 % SOLN, Place 1 drop into both eyes at bedtime., Disp: , Rfl:    calcium carbonate (OS-CAL) 600 MG TABS tablet, Take 600 mg by mouth daily with breakfast. For calcium supplement, Disp: , Rfl:    ferrous sulfate 325 (65 FE) MG EC tablet, Take 1 tablet by mouth every other day., Disp: , Rfl:    Multiple Vitamin (MULTIVITAMIN WITH MINERALS) TABS tablet, Take 1 tablet by mouth daily. For supplement, Disp: , Rfl:    oxyCODONE (OXY IR/ROXICODONE) 5 MG immediate release tablet, Take 5 mg by mouth every 4 (four) hours as needed., Disp: , Rfl:    rosuvastatin (CRESTOR) 5 MG tablet, Take 1 tablet (5 mg total) by mouth daily., Disp: 90 tablet, Rfl: 3   timolol (TIMOPTIC) 0.5 % ophthalmic solution, Place 1 drop into both eyes every morning., Disp: , Rfl:    triamterene-hydrochlorothiazide (MAXZIDE-25) 37.5-25 MG tablet, Take 0.5 tablets by mouth every morning., Disp: , Rfl:    valsartan (DIOVAN) 160 MG tablet, Take 80 mg by mouth every evening., Disp: , Rfl:    Radiology:   Chest x-ray two-view on 02/08/2018: Linear scarring remains at the lung bases. No pneumonia or pleural effusion is seen. Mediastinal and hilar contours are unremarkable. The heart is within upper limits normal and stable. Hardware for fusion of a portion of the lumbar spine is noted.  Cardiac Studies:   Lexiscan myoview stress test 02/21/2018: 1. Lexiscan stress test performed. Exercise capacity not assessed. Stress symptoms included dyspnea. Peak BP 160/86 mmHg. 2. The overall quality of the study is fair. Review of the raw data in a  rotational cine format reveals diaphragmatic attenuation. Left ventricular cavity is noted to be normal on the rest and stress studies. Gated SPECT images reveal normal myocardial thickening and wall motion. The left ventricular ejection fraction  was calculated to be 43%, although visually appears normal. Small size mild intensity perfusion defect in apical inferolateral region seen on rest images, that improves on stress images. These defects are likely related to diaphragmatic attenuation. 3. Low to intermediate risk study due to decreased LVEF.  Echocardiogram 07/08/2020: Left ventricle cavity is normal in size. Moderate concentric hypertrophy of the left ventricle. Normal global wall motion. Normal LV systolic function with visual EF 50-55%. Doppler evidence of grade I (impaired) diastolic dysfunction, normal LAP.  Left atrial cavity is mildly dilated. Trileaflet aortic valve with mild calcification. Moderate (Grade II) aortic regurgitation. Moderate (Grade II) mitral regurgitation. Mild tricuspid regurgitation.  No evidence of pulmonary hypertension. Compared to previous study on 09/14/2018, dilated aortic root not appreciated on this study.  EKG  EKG 04/20/2023: Sinus rhythm with first-degree AV block at the rate of 90 bpm, left atrial enlargement, left axis deviation, left anterior fascicular block.  Right bundle branch block.  Trifascicular block.  Low-voltage complexes.  Pulmonary disease pattern.  Compared to 09/30/2022, no significant change.  Assessment     ICD-10-CM   1. Primary hypertension  I10     2. Trifascicular block  I45.3     3. Stage 3a chronic kidney disease (HCC)  N18.31       Medications Discontinued During This Encounter  Medication Reason   ibuprofen (ADVIL) 800 MG tablet       No orders of the defined types were placed in this encounter.  Recommendations:   Anthony Clay is a 89 y.o. Caucasian male with hypertension with stage IIIa chronic kidney  disease, trifascicular block, hyperlipidemia, moderate mitral and aortic regurgitation presents here for 6 week visit.    1. Primary hypertension Patient's renal function has returned to baseline, he was admitted to emergency room about 2 months ago and he had developed acute renal failure.  He is now back on the medications, on valsartan 80 mg in the evening and Maxide in the morning, blood pressure under excellent control and remains asymptomatic.    2. Trifascicular block He has underlying trifascicular block.  EKG has remained stable.  He remains asymptomatic without dizziness or syncope, will continue close monitoring.  Is presently 89 years of age.  I would like to see him back in 6 months with repeat EKG.  3. Stage 3a chronic kidney disease (HCC) I reviewed his labs from PCP, renal function has returned to baseline.  He is on appropriate medical therapy, no changes were done today.  I will see him back in 6 months.    Yates Decamp, MD, [facility] 06/01/2023, 11:26 AM Office: [phone] Fax: [phone] Pager: [phone]

## 2023-08-09 ENCOUNTER — Other Ambulatory Visit: Payer: Self-pay

## 2023-08-09 DIAGNOSIS — E7800 Pure hypercholesterolemia, unspecified: Secondary | ICD-10-CM

## 2023-08-09 MED ORDER — ROSUVASTATIN CALCIUM 5 MG PO TABS
5.0000 mg | ORAL_TABLET | Freq: Every day | ORAL | 2 refills | Status: DC
Start: 2023-08-09 — End: 2024-05-05

## 2023-09-30 ENCOUNTER — Ambulatory Visit: Payer: Medicare Other | Admitting: Cardiology

## 2023-12-02 ENCOUNTER — Ambulatory Visit: Payer: Self-pay | Admitting: Cardiology

## 2024-01-03 ENCOUNTER — Encounter: Payer: Self-pay | Admitting: Cardiology

## 2024-01-03 ENCOUNTER — Ambulatory Visit: Payer: Medicare Other | Attending: Cardiology | Admitting: Cardiology

## 2024-01-03 VITALS — BP 122/76 | HR 90 | Resp 16 | Ht 72.0 in | Wt 216.4 lb

## 2024-01-03 DIAGNOSIS — I453 Trifascicular block: Secondary | ICD-10-CM | POA: Diagnosis not present

## 2024-01-03 DIAGNOSIS — N1832 Chronic kidney disease, stage 3b: Secondary | ICD-10-CM | POA: Insufficient documentation

## 2024-01-03 DIAGNOSIS — I10 Essential (primary) hypertension: Secondary | ICD-10-CM | POA: Insufficient documentation

## 2024-01-03 MED ORDER — TRIAMTERENE-HCTZ 37.5-25 MG PO TABS
0.5000 | ORAL_TABLET | ORAL | 1 refills | Status: AC
Start: 2024-01-03 — End: ?

## 2024-01-03 NOTE — Patient Instructions (Signed)

## 2024-01-03 NOTE — Progress Notes (Signed)
 Cardiology Office Note:  .   Date:  01/03/2024  ID:  Anthony Clay, DOB 09-Apr-1928, MRN [id number] PCP: Richmond Campbell PA-C  [facility] Providers Cardiologist:  Yates Decamp, MD   History of Present Illness: Anthony Clay is a 89 y.o. Caucasian male with hypertension with stage IIIa chronic kidney disease, trifascicular block, hyperlipidemia, moderate mitral and aortic regurgitation presents for a 6 month visit.   Discussed the use of AI scribe software for clinical note transcription with the patient, who gave verbal consent to proceed.  History of Present Illness   The patient, with a history of heart disease, stage 3B kidney disease, and hypertension, presents with shortness of breath during exertion and loud, raspy breathing during sleep. The patient's wife reports that his breathing is very loud and raspy, likening it to the sound of a bear or a dog. The patient denies any chest pain, dizziness, or feeling like he is going to pass out. The patient's wife suggests that post nasal drip and acid reflux could be contributing to his loud breathing. The patient sleeps on his side and does not sleep reclined.  The patient's kidney function has remained stable and his blood count and cholesterol levels are good. The patient is due for another blood draw in about a month for comparison. The patient's EKG shows a left anterior fasciculobloc, right frontal branch block, and trifasciculobloc, which are unchanged from the previous year.      Labs    Lab Results  Component Value Date   NA 139 04/13/2023   K 3.7 04/13/2023   CO2 26 04/13/2023   GLUCOSE 88 04/13/2023   BUN 43 (H) 04/13/2023   CREATININE 1.91 (H) 04/13/2023   CALCIUM 9.4 04/13/2023   GFRNONAA 32 (L) 04/13/2023      Latest Ref Rng & Units 04/13/2023   12:47 PM 08/02/2020   11:01 AM 07/05/2020    8:42 AM  BMP  Glucose 70 - 99 mg/dL 88  71  119   BUN 8 - 23 mg/dL 43  29  31   Creatinine 0.61 - 1.24 mg/dL  1.47  8.29  5.62   BUN/Creat Ratio 10 - 24  20  19    Sodium 135 - 145 mmol/L 139  138  137   Potassium 3.5 - 5.1 mmol/L 3.7  4.7  4.7   Chloride 98 - 111 mmol/L 102  101  99   CO2 22 - 32 mmol/L 26  24  23    Calcium 8.9 - 10.3 mg/dL 9.4  9.6  9.7       Latest Ref Rng & Units 04/13/2023   12:47 PM 01/10/2014    4:51 PM 01/10/2014    2:20 PM  CBC  WBC 4.0 - 10.5 K/uL 9.0     Hemoglobin 13.0 - 17.0 g/dL 13.0  86.5  9.5   Hematocrit 39.0 - 52.0 % 37.9  31.0  28.0   Platelets 150 - 400 K/uL 285       External Labs:  Labs 12/09/2023 on Care Everywhere  Sodium 141, potassium 4.0, serum glucose 120, BUN 27, creatinine 1.63, EGFR 39 mL, LFTs normal.  Hb 13.2/HCT 38.4, platelets 239.  Total cholesterol 155, triglycerides 229, HDL 53, LDL 70.  B12 and iron studies normal.  Review of Systems  Cardiovascular:  Positive for dyspnea on exertion (stable). Negative for chest pain and leg swelling.   Physical Exam:   VS:  BP 122/76 (BP Location:  Left Arm, Patient Position: Sitting, Cuff Size: Large)   Pulse 90   Resp 16   Ht 6' (1.829 m)   Wt 216 lb 6.4 oz (98.2 kg)   SpO2 97%   BMI 29.35 kg/m    Wt Readings from Last 3 Encounters:  01/03/24 216 lb 6.4 oz (98.2 kg)  06/01/23 200 lb (90.7 kg)  04/20/23 197 lb 12.8 oz (89.7 kg)    Physical Exam Neck:     Vascular: No carotid bruit or JVD.  Cardiovascular:     Rate and Rhythm: Normal rate and regular rhythm.     Pulses: Intact distal pulses.          Dorsalis pedis pulses are 1+ on the right side and 1+ on the left side.       Posterior tibial pulses are 1+ on the right side and 1+ on the left side.     Heart sounds: Normal heart sounds. No murmur heard.    No gallop.  Pulmonary:     Effort: Pulmonary effort is normal.     Breath sounds: Normal breath sounds.  Abdominal:     General: Bowel sounds are normal.     Palpations: Abdomen is soft.  Musculoskeletal:     Right lower leg: No edema.     Left lower leg: No edema.     Studies Reviewed: .    Echocardiogram 07/08/2020: Left ventricle cavity is normal in size. Moderate concentric hypertrophy of the left ventricle. Normal global wall motion. Normal LV systolic function with visual EF 50-55%. Doppler evidence of grade I (impaired) diastolic dysfunction, normal LAP.  Left atrial cavity is mildly dilated. Trileaflet aortic valve with mild calcification. Moderate (Grade II) aortic regurgitation. Moderate (Grade II) mitral regurgitation. Mild tricuspid regurgitation.  No evidence of pulmonary hypertension.  EKG:    EKG Interpretation Date/Time:  Monday January 03 2024 11:38:50 EST Ventricular Rate:  85 PR Interval:  216 QRS Duration:  116 QT Interval:  398 QTC Calculation: 473 R Axis:   -67  Text Interpretation: EKG 01/03/2024: Sinus rhythm with first-degree AV block at rate of 85 bpm, left anterior fascicular block.  Right bundle branch block.  Trifascicular block.  Compared to 04/13/2023, QRS widening slightly less.  Otherwise no significant change. Confirmed by Delrae Rend [phone]) on 01/03/2024 11:40:34 AM    EKG 04/20/2023: Sinus rhythm with first-degree AV block at the rate of 90 bpm, left atrial enlargement, left axis deviation, left anterior fascicular block.  Right bundle branch block.  Trifascicular block.  Low-voltage complexes.  Pulmonary disease pattern.   Medications and allergies    No Known Allergies   Current Outpatient Medications:    B Complex Vitamins (VITAMIN B COMPLEX) TABS, Take 1 tablet by mouth daily., Disp: , Rfl:    bimatoprost (LUMIGAN) 0.01 % SOLN, Place 1 drop into both eyes at bedtime., Disp: , Rfl:    calcium carbonate (OS-CAL) 600 MG TABS tablet, Take 600 mg by mouth daily with breakfast. For calcium supplement, Disp: , Rfl:    ferrous sulfate 325 (65 FE) MG EC tablet, Take 1 tablet by mouth every other day., Disp: , Rfl:    Multiple Vitamin (MULTIVITAMIN WITH MINERALS) TABS tablet, Take 1 tablet by mouth daily. For  supplement, Disp: , Rfl:    oxyCODONE (OXY IR/ROXICODONE) 5 MG immediate release tablet, Take 5 mg by mouth every 4 (four) hours as needed., Disp: , Rfl:    rosuvastatin (CRESTOR) 5 MG tablet, Take 1 tablet (5  mg total) by mouth daily., Disp: 90 tablet, Rfl: 2   timolol (TIMOPTIC) 0.5 % ophthalmic solution, Place 1 drop into both eyes every morning., Disp: , Rfl:    valsartan (DIOVAN) 160 MG tablet, Take 80 mg by mouth every evening., Disp: , Rfl:    triamterene-hydrochlorothiazide (MAXZIDE-25) 37.5-25 MG tablet, Take 0.5 tablets by mouth every morning., Disp: 90 tablet, Rfl: 1   ASSESSMENT AND PLAN: .      ICD-10-CM   1. Trifascicular block  I45.3 EKG 12-Lead    2. Primary hypertension  I10 triamterene-hydrochlorothiazide (MAXZIDE-25) 37.5-25 MG tablet    3. Stage 3b chronic kidney disease (HCC)  N18.32      Assessment and Plan    Trifascicular Block The trifascicular block includes left anterior fasciculoblock, right bundle branch block, and a widened QRS complex. The EKG remains unchanged from last year.  Monitoring for progression to complete heart block is necessary, as it could lead to fatigue, weakness, or syncope.  Symptoms of fatigue, unexplained dizziness or syncope explained to the patient and his wife.  An annual EKG is planned. Contact if the heart rate drops to 30-40 bpm or if unexplained syncope occurs.  Hypertension Currently on valsartan 160 mg, which is halved, and comfortable with this regimen. Triamterene is also taken half tablet daily, though cutting the tablets is difficult due to his size.   Stage 3B Chronic Kidney Disease Kidney function is well-managed. Increased water intake is advised by the PCP. Blood count, cholesterol, B12, and iron studies are normal. A follow-up blood draw is scheduled for March 3rd for comparison. Continue current management.  Renal function has remained stable.  He is 89 years of age, hence I did not consider starting him on SGLT2  inhibitors.  General Health Maintenance No reports of chest pain, shortness of breath, or dizziness. Loud breathing at night may be due to post-nasal drip or acid reflux. Elevating the head of the bed with a pillow is suggested to improve sleep quality.  Follow-up Schedule a follow-up visit in one year.   Signed,  Yates Decamp, MD, [facility] 01/03/2024, 11:59 AM [facility] [address] [place], [place] [address] Phone: [phone]. Fax:  [phone]

## 2024-01-10 LAB — LAB REPORT - SCANNED
A1c: 6.2
EGFR: 42

## 2024-05-05 ENCOUNTER — Other Ambulatory Visit: Payer: Self-pay | Admitting: Cardiology

## 2024-05-05 DIAGNOSIS — E7800 Pure hypercholesterolemia, unspecified: Secondary | ICD-10-CM

## 2025-01-15 ENCOUNTER — Ambulatory Visit: Admitting: Cardiology
